# Patient Record
Sex: Female | Born: 1985 | Race: White | Hispanic: No | Smoking: Never smoker
Health system: Southern US, Community
[De-identification: ages and names within clinical notes are randomized; demographics above are authoritative.]

## PROBLEM LIST (undated history)

## (undated) DIAGNOSIS — F419 Anxiety disorder, unspecified: Secondary | ICD-10-CM

## (undated) DIAGNOSIS — J45909 Unspecified asthma, uncomplicated: Secondary | ICD-10-CM

## (undated) DIAGNOSIS — Z85820 Personal history of malignant melanoma of skin: Secondary | ICD-10-CM

## (undated) DIAGNOSIS — F32A Depression, unspecified: Secondary | ICD-10-CM

## (undated) DIAGNOSIS — IMO0002 Reserved for concepts with insufficient information to code with codable children: Secondary | ICD-10-CM

## (undated) DIAGNOSIS — Z9889 Other specified postprocedural states: Secondary | ICD-10-CM

## (undated) DIAGNOSIS — N840 Polyp of corpus uteri: Secondary | ICD-10-CM

## (undated) DIAGNOSIS — Z3480 Encounter for supervision of other normal pregnancy, unspecified trimester: Secondary | ICD-10-CM

## (undated) DIAGNOSIS — F329 Major depressive disorder, single episode, unspecified: Secondary | ICD-10-CM

## (undated) DIAGNOSIS — K589 Irritable bowel syndrome without diarrhea: Secondary | ICD-10-CM

## (undated) DIAGNOSIS — B009 Herpesviral infection, unspecified: Secondary | ICD-10-CM

## (undated) HISTORY — DX: Irritable bowel syndrome, unspecified: K58.9

## (undated) HISTORY — PX: INDUCED ABORTION: SHX677

## (undated) HISTORY — DX: Depression, unspecified: F32.A

## (undated) HISTORY — DX: Reserved for concepts with insufficient information to code with codable children: IMO0002

## (undated) HISTORY — DX: Herpesviral infection, unspecified: B00.9

## (undated) HISTORY — DX: Other specified postprocedural states: Z98.890

## (undated) HISTORY — DX: Anxiety disorder, unspecified: F41.9

## (undated) HISTORY — DX: Other specified postprocedural states: Z85.820

## (undated) HISTORY — DX: Major depressive disorder, single episode, unspecified: F32.9

---
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
## 2004-02-06 ENCOUNTER — Emergency Department (HOSPITAL_COMMUNITY): Admission: EM | Admit: 2004-02-06 | Discharge: 2004-02-06 | Payer: Self-pay | Admitting: Emergency Medicine

## 2005-02-26 ENCOUNTER — Emergency Department (HOSPITAL_COMMUNITY): Admission: EM | Admit: 2005-02-26 | Discharge: 2005-02-26 | Payer: Self-pay | Admitting: Emergency Medicine
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
## 2005-10-14 ENCOUNTER — Emergency Department (HOSPITAL_COMMUNITY): Admission: EM | Admit: 2005-10-14 | Discharge: 2005-10-14 | Payer: Self-pay | Admitting: Emergency Medicine

## 2006-04-18 ENCOUNTER — Emergency Department (HOSPITAL_COMMUNITY): Admission: EM | Admit: 2006-04-18 | Discharge: 2006-04-18 | Payer: Self-pay | Admitting: Emergency Medicine

## 2009-02-05 ENCOUNTER — Inpatient Hospital Stay (HOSPITAL_COMMUNITY): Admission: AD | Admit: 2009-02-05 | Discharge: 2009-02-05 | Payer: Self-pay | Admitting: Obstetrics and Gynecology

## 2009-04-08 ENCOUNTER — Inpatient Hospital Stay (HOSPITAL_COMMUNITY): Admission: AD | Admit: 2009-04-08 | Discharge: 2009-04-08 | Payer: Self-pay | Admitting: Obstetrics and Gynecology

## 2010-02-06 ENCOUNTER — Inpatient Hospital Stay (HOSPITAL_COMMUNITY): Admission: AD | Admit: 2010-02-06 | Discharge: 2009-06-06 | Payer: Self-pay | Admitting: Obstetrics and Gynecology

## 2010-05-21 LAB — CBC
HCT: 35.1 % — ABNORMAL LOW (ref 36.0–46.0)
HCT: 41.7 % (ref 36.0–46.0)
Hemoglobin: 12.2 g/dL (ref 12.0–15.0)
Hemoglobin: 14.1 g/dL (ref 12.0–15.0)
MCHC: 33.9 g/dL (ref 30.0–36.0)
MCHC: 34.7 g/dL (ref 30.0–36.0)
MCV: 94.4 fL (ref 78.0–100.0)
MCV: 94.9 fL (ref 78.0–100.0)
Platelets: 115 10*3/uL — ABNORMAL LOW (ref 150–400)
Platelets: 142 10*3/uL — ABNORMAL LOW (ref 150–400)
RBC: 3.7 MIL/uL — ABNORMAL LOW (ref 3.87–5.11)
RBC: 4.42 MIL/uL (ref 3.87–5.11)
RDW: 13.6 % (ref 11.5–15.5)
RDW: 14.3 % (ref 11.5–15.5)
WBC: 10.2 10*3/uL (ref 4.0–10.5)
WBC: 13.7 10*3/uL — ABNORMAL HIGH (ref 4.0–10.5)

## 2010-05-21 LAB — RPR: RPR Ser Ql: NONREACTIVE

## 2010-05-22 LAB — STREP B DNA PROBE

## 2010-06-03 LAB — URINALYSIS, ROUTINE W REFLEX MICROSCOPIC
Hgb urine dipstick: NEGATIVE
Specific Gravity, Urine: 1.01 (ref 1.005–1.030)
Urobilinogen, UA: 0.2 mg/dL (ref 0.0–1.0)
pH: 7 (ref 5.0–8.0)

## 2010-06-03 LAB — WET PREP, GENITAL

## 2010-10-27 ENCOUNTER — Emergency Department (HOSPITAL_COMMUNITY)
Admission: EM | Admit: 2010-10-27 | Discharge: 2010-10-27 | Disposition: A | Discharge status: Home / Self Care | Payer: No Typology Code available for payment source | Attending: Emergency Medicine | Admitting: Emergency Medicine

## 2010-10-27 DIAGNOSIS — J45909 Unspecified asthma, uncomplicated: Secondary | ICD-10-CM | POA: Insufficient documentation

## 2010-10-27 DIAGNOSIS — F411 Generalized anxiety disorder: Secondary | ICD-10-CM | POA: Insufficient documentation

## 2010-10-27 DIAGNOSIS — O99891 Other specified diseases and conditions complicating pregnancy: Secondary | ICD-10-CM | POA: Insufficient documentation

## 2010-10-27 LAB — COMPREHENSIVE METABOLIC PANEL
AST: 14 U/L (ref 0–37)
Alkaline Phosphatase: 77 U/L (ref 39–117)
Calcium: 9.1 mg/dL (ref 8.4–10.5)
Chloride: 105 mEq/L (ref 96–112)
Creatinine, Ser: 0.65 mg/dL (ref 0.50–1.10)
GFR calc Af Amer: >60 mL/min (ref >60)
Sodium: 137 mEq/L (ref 135–145)

## 2010-10-27 LAB — RAPID URINE DRUG SCREEN, HOSP PERFORMED
Amphetamines: NOT DETECTED
Barbiturates: NOT DETECTED
Benzodiazepines: POSITIVE — AB
Cocaine: NOT DETECTED

## 2010-10-27 LAB — DIFFERENTIAL
Basophils Absolute: 0 10*3/uL (ref 0.0–0.1)
Eosinophils Absolute: 0.1 10*3/uL (ref 0.0–0.7)
Eosinophils Relative: 2 % (ref 0–5)
Monocytes Relative: 12 % (ref 3–12)
Neutrophils Relative %: 53 % (ref 43–77)

## 2010-10-27 LAB — CBC
HCT: 39.4 % (ref 36.0–46.0)
MCHC: 34 g/dL (ref 30.0–36.0)
Platelets: 217 10*3/uL (ref 150–400)
RDW: 13.1 % (ref 11.5–15.5)

## 2010-10-27 LAB — URINALYSIS, ROUTINE W REFLEX MICROSCOPIC
Bilirubin Urine: NEGATIVE
Glucose, UA: NEGATIVE mg/dL
Ketones, ur: NEGATIVE mg/dL
Leukocytes, UA: NEGATIVE
Protein, ur: NEGATIVE mg/dL
Urobilinogen, UA: 1 mg/dL (ref 0.0–1.0)
pH: 6.5 (ref 5.0–8.0)

## 2010-11-07 ENCOUNTER — Inpatient Hospital Stay (HOSPITAL_COMMUNITY)
Admission: AD | Admit: 2010-11-07 | Discharge: 2010-11-07 | Disposition: A | Discharge status: Home / Self Care | Payer: No Typology Code available for payment source | Source: Ambulatory Visit | Attending: Obstetrics and Gynecology | Admitting: Obstetrics and Gynecology

## 2010-11-07 ENCOUNTER — Inpatient Hospital Stay (HOSPITAL_COMMUNITY): Payer: No Typology Code available for payment source

## 2010-11-07 ENCOUNTER — Encounter (HOSPITAL_COMMUNITY): Payer: Self-pay | Admitting: *Deleted

## 2010-11-07 DIAGNOSIS — N831 Corpus luteum cyst of ovary, unspecified side: Secondary | ICD-10-CM | POA: Insufficient documentation

## 2010-11-07 DIAGNOSIS — R42 Dizziness and giddiness: Secondary | ICD-10-CM | POA: Insufficient documentation

## 2010-11-07 DIAGNOSIS — O99891 Other specified diseases and conditions complicating pregnancy: Secondary | ICD-10-CM | POA: Insufficient documentation

## 2010-11-07 DIAGNOSIS — R110 Nausea: Secondary | ICD-10-CM | POA: Insufficient documentation

## 2010-11-07 DIAGNOSIS — R109 Unspecified abdominal pain: Secondary | ICD-10-CM | POA: Insufficient documentation

## 2010-11-07 DIAGNOSIS — O34599 Maternal care for other abnormalities of gravid uterus, unspecified trimester: Secondary | ICD-10-CM | POA: Insufficient documentation

## 2010-11-07 LAB — CBC
HCT: 40 % (ref 36.0–46.0)
MCH: 30 pg (ref 26.0–34.0)
MCHC: 34 g/dL (ref 30.0–36.0)
Platelets: 251 10*3/uL (ref 150–400)
RDW: 13.1 % (ref 11.5–15.5)
WBC: 9.3 10*3/uL (ref 4.0–10.5)

## 2010-11-07 LAB — DIFFERENTIAL
Basophils Relative: 0 % (ref 0–1)
Lymphs Abs: 2.8 10*3/uL (ref 0.7–4.0)
Monocytes Absolute: 0.8 10*3/uL (ref 0.1–1.0)
Neutro Abs: 5.5 10*3/uL (ref 1.7–7.7)

## 2010-11-07 LAB — CREATININE, SERUM
Creatinine, Ser: 0.47 mg/dL — ABNORMAL LOW (ref 0.50–1.10)
GFR calc Af Amer: >60 mL/min (ref >60)
GFR calc non Af Amer: >60 mL/min (ref >60)

## 2010-11-07 LAB — HCG, QUANTITATIVE, PREGNANCY: hCG, Beta Chain, Quant, S: 33522 m[IU]/mL — ABNORMAL HIGH (ref <5)

## 2010-11-07 LAB — AST: AST: 14 U/L (ref 0–37)

## 2010-11-07 NOTE — Plan of Care (Signed)
[place] not read : unofficial report IUP, [place] trying to get report read, pt left to eat without asking staff, she was called via phone and says she is returning now, she had informed the front desk of her leaving

## 2010-11-07 NOTE — ED Provider Notes (Signed)
History     Chief Complaint  Patient presents with  . Abdominal Pain  . Dizziness  . Nausea   Pt is a G3P2  HPI Comments: Pt has vague complaints of feeling flush, nauseous, lack of appetite, dizzy. Denies VB or d/c. States pain is intermittent and not associated with symptoms.   Abdominal Pain This is a new problem. The current episode started in the past 7 days. The onset quality is gradual. The problem occurs daily. The problem has been unchanged. The pain is located in the suprapubic region. The pain is mild. The quality of the pain is cramping. The abdominal pain does not radiate. Associated symptoms include anorexia and nausea. The pain is aggravated by nothing. The pain is relieved by nothing. She has tried nothing for the symptoms. Prior diagnostic workup includes ultrasound.     No past medical history on file.  No past surgical history on file.  No family history on file.  History  Substance Use Topics  . Smoking status: Not on file  . Smokeless tobacco: Not on file  . Alcohol Use: Not on file    Allergies:  Allergies  Allergen Reactions  . Latex     Skin irritation  . Neomycin Swelling    No prescriptions prior to admission    Review of Systems  Constitutional: Positive for chills and malaise/fatigue.  Gastrointestinal: Positive for nausea, abdominal pain and anorexia.  Neurological: Positive for dizziness and weakness.  All other systems reviewed and are negative.   Physical Exam   Blood pressure 108/66, pulse 92, temperature 98.4 F (36.9 C), resp. rate 18, height 6' 3.5" (1.918 m), weight 70.818 kg (156 lb 2 oz), last menstrual period 09/24/2010.  Physical Exam  Constitutional: She is oriented to person, place, and time. She appears well-developed and well-nourished.  Cardiovascular: Normal rate.   Respiratory: Effort normal.  GI: Soft.  Neurological: She is alert and oriented to person, place, and time.  Skin: Skin is warm and dry.    MAU  Course  Procedures [place] CBC    Assessment and Plan  US reveals GS at [redacted]w[redacted]d - minimally deformed, no embryo or cardiac activity, +YS, R corpus luteum cyst CBC nl Wbc - 9.3 hgb - 13.6 hct - 40 plt - 251 Diff is nl BCHG quant - 33, 522 O pos  Will repeat quant on 9/9  D/W Dr Evlyn Kanner M 11/07/2010, 8:59 PM

## 2010-11-09 ENCOUNTER — Inpatient Hospital Stay (HOSPITAL_COMMUNITY)
Admission: AD | Admit: 2010-11-09 | Discharge: 2010-11-09 | Disposition: A | Discharge status: Home / Self Care | Payer: No Typology Code available for payment source | Source: Ambulatory Visit | Attending: Obstetrics and Gynecology | Admitting: Obstetrics and Gynecology

## 2010-11-09 DIAGNOSIS — O99891 Other specified diseases and conditions complicating pregnancy: Secondary | ICD-10-CM | POA: Insufficient documentation

## 2010-11-09 NOTE — Progress Notes (Signed)
No bleeding no pain at present here for BHCG

## 2010-11-09 NOTE — Progress Notes (Signed)
S: pt denies pain or VB  O: GHCG on 9/7 was 33, 522 Today on 9/9 was 46, 123  A: IUP at 6wks without embryo  P: F/U with viability [place] with appt on Thurs Pt to come in with severe pain or bleeding

## 2010-11-11 ENCOUNTER — Other Ambulatory Visit (HOSPITAL_COMMUNITY): Payer: Self-pay | Admitting: Obstetrics and Gynecology

## 2010-11-11 ENCOUNTER — Ambulatory Visit (HOSPITAL_COMMUNITY)
Admission: RE | Admit: 2010-11-11 | Discharge: 2010-11-11 | Disposition: A | Discharge status: Home / Self Care | Payer: No Typology Code available for payment source | Source: Ambulatory Visit | Attending: Obstetrics and Gynecology | Admitting: Obstetrics and Gynecology

## 2010-11-11 DIAGNOSIS — O3680X0 Pregnancy with inconclusive fetal viability, not applicable or unspecified: Secondary | ICD-10-CM

## 2010-11-11 DIAGNOSIS — Z3689 Encounter for other specified antenatal screening: Secondary | ICD-10-CM | POA: Insufficient documentation

## 2010-11-11 DIAGNOSIS — O209 Hemorrhage in early pregnancy, unspecified: Secondary | ICD-10-CM | POA: Insufficient documentation

## 2010-12-19 LAB — GC/CHLAMYDIA PROBE AMP, GENITAL: Chlamydia: NEGATIVE

## 2010-12-19 LAB — HEPATITIS B SURFACE ANTIGEN: Hepatitis B Surface Ag: NEGATIVE

## 2010-12-19 LAB — RUBELLA ANTIBODY, IGM: Rubella: UNDETERMINED

## 2010-12-19 LAB — ABO/RH: RH Type: POSITIVE

## 2010-12-19 LAB — ANTIBODY SCREEN: Antibody Screen: NEGATIVE

## 2011-03-03 NOTE — L&D Delivery Note (Signed)
Delivery Note At 6:06 PM a viable female was delivered via  (Presentation: OP rotated to OA; ROT  ).  APGAR: 8 ,9; weight 8#10.   Placenta status: delivered intact to patient disposal for encapsulation  Cord:  with the following complications: none .    Anesthesia:  epidural Episiotomy: none Lacerations: 1st degree Suture Repair: 3.0 vicryl rapide Est. Blood Loss (mL): 500cc  Mom to postpartum.  Baby to nursery-stable.  BOVARD,Paddy Neis 06/24/2011, 6:29 PM  Br/O+/Contra - POPs/R=

## 2011-06-08 LAB — STREP B DNA PROBE: GBS: NEGATIVE

## 2011-06-18 ENCOUNTER — Encounter (HOSPITAL_COMMUNITY): Payer: Self-pay | Admitting: *Deleted

## 2011-06-18 ENCOUNTER — Telehealth (HOSPITAL_COMMUNITY): Payer: Self-pay | Admitting: *Deleted

## 2011-06-18 NOTE — Telephone Encounter (Signed)
Preadmission screen  

## 2011-06-23 ENCOUNTER — Encounter (HOSPITAL_COMMUNITY): Payer: Self-pay

## 2011-06-23 DIAGNOSIS — Z3480 Encounter for supervision of other normal pregnancy, unspecified trimester: Secondary | ICD-10-CM

## 2011-06-23 HISTORY — DX: Encounter for supervision of other normal pregnancy, unspecified trimester: Z34.80
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
## 2011-06-24 ENCOUNTER — Inpatient Hospital Stay (HOSPITAL_COMMUNITY)
Admission: RE | Admit: 2011-06-24 | Discharge: 2011-06-26 | DRG: 774 | Disposition: A | Discharge status: Home / Self Care | Payer: Medicaid Other | Source: Ambulatory Visit | Attending: Obstetrics and Gynecology | Admitting: Obstetrics and Gynecology

## 2011-06-24 ENCOUNTER — Encounter (HOSPITAL_COMMUNITY): Payer: Self-pay

## 2011-06-24 ENCOUNTER — Inpatient Hospital Stay (HOSPITAL_COMMUNITY): Payer: Medicaid Other | Admitting: Anesthesiology

## 2011-06-24 ENCOUNTER — Encounter (HOSPITAL_COMMUNITY): Payer: Self-pay | Admitting: Anesthesiology

## 2011-06-24 VITALS — BP 119/74 | HR 83 | Temp 97.3°F | Resp 18 | Ht 65.5 in | Wt 184.0 lb

## 2011-06-24 DIAGNOSIS — A6000 Herpesviral infection of urogenital system, unspecified: Secondary | ICD-10-CM | POA: Diagnosis present

## 2011-06-24 DIAGNOSIS — O98519 Other viral diseases complicating pregnancy, unspecified trimester: Secondary | ICD-10-CM | POA: Diagnosis present

## 2011-06-24 DIAGNOSIS — Z3480 Encounter for supervision of other normal pregnancy, unspecified trimester: Secondary | ICD-10-CM

## 2011-06-24 HISTORY — DX: Encounter for supervision of other normal pregnancy, unspecified trimester: Z34.80

## 2011-06-24 LAB — RPR: RPR Ser Ql: NONREACTIVE

## 2011-06-24 LAB — CBC
Hemoglobin: 12.5 g/dL (ref 12.0–15.0)
RBC: 4.08 MIL/uL (ref 3.87–5.11)

## 2011-06-24 MED ORDER — LACTATED RINGERS IV SOLN
INTRAVENOUS | Status: DC
  Administered 2011-06-24 (×2): via INTRAVENOUS

## 2011-06-24 MED ORDER — LACTATED RINGERS IV SOLN
500.0000 mL | Freq: Once | INTRAVENOUS | Status: AC
  Administered 2011-06-24: 500 mL via INTRAVENOUS

## 2011-06-24 MED ORDER — PRENATAL MULTIVITAMIN CH
1.0000 | ORAL_TABLET | Freq: Every day | ORAL | Status: DC
  Administered 2011-06-25 – 2011-06-26 (×2): 1 via ORAL
  Filled 2011-06-24 (×2): qty 1

## 2011-06-24 MED ORDER — LIDOCAINE HCL (PF) 1 % IJ SOLN
30.0000 mL | INTRAMUSCULAR | Status: DC | PRN
  Filled 2011-06-24: qty 30

## 2011-06-24 MED ORDER — BENZOCAINE-MENTHOL 20-0.5 % EX AERO
1.0000 "application " | INHALATION_SPRAY | CUTANEOUS | Status: DC | PRN
  Filled 2011-06-24: qty 56

## 2011-06-24 MED ORDER — WITCH HAZEL-GLYCERIN EX PADS: 1.0000 "application " | MEDICATED_PAD | CUTANEOUS | Status: DC | PRN

## 2011-06-24 MED ORDER — DIPHENHYDRAMINE HCL 50 MG/ML IJ SOLN
12.5000 mg | INTRAMUSCULAR | Status: DC | PRN
  Administered 2011-06-24: 12.5 mg via INTRAVENOUS
  Filled 2011-06-24: qty 1

## 2011-06-24 MED ORDER — OXYCODONE-ACETAMINOPHEN 5-325 MG PO TABS
1.0000 | ORAL_TABLET | ORAL | Status: DC | PRN
  Administered 2011-06-25 (×2): 1 via ORAL
  Filled 2011-06-24: qty 1
  Filled 2011-06-24: qty 2

## 2011-06-24 MED ORDER — ZOLPIDEM TARTRATE 5 MG PO TABS: 5.0000 mg | ORAL_TABLET | Freq: Every evening | ORAL | Status: DC | PRN

## 2011-06-24 MED ORDER — CITRIC ACID-SODIUM CITRATE 334-500 MG/5ML PO SOLN: 30.0000 mL | ORAL | Status: DC | PRN

## 2011-06-24 MED ORDER — PRENATAL MULTIVITAMIN CH: 1.0000 | ORAL_TABLET | Freq: Every day | ORAL | Status: DC

## 2011-06-24 MED ORDER — ONDANSETRON HCL 4 MG/2ML IJ SOLN: 4.0000 mg | INTRAMUSCULAR | Status: DC | PRN

## 2011-06-24 MED ORDER — FLEET ENEMA 7-19 GM/118ML RE ENEM: 1.0000 | ENEMA | RECTAL | Status: DC | PRN

## 2011-06-24 MED ORDER — OXYCODONE-ACETAMINOPHEN 5-325 MG PO TABS: 1.0000 | ORAL_TABLET | ORAL | Status: DC | PRN

## 2011-06-24 MED ORDER — SENNOSIDES-DOCUSATE SODIUM 8.6-50 MG PO TABS
2.0000 | ORAL_TABLET | Freq: Every day | ORAL | Status: DC
  Administered 2011-06-24: 2 via ORAL

## 2011-06-24 MED ORDER — LACTATED RINGERS IV SOLN: INTRAVENOUS | Status: DC

## 2011-06-24 MED ORDER — DIPHENHYDRAMINE HCL 25 MG PO CAPS: 25.0000 mg | ORAL_CAPSULE | Freq: Four times a day (QID) | ORAL | Status: DC | PRN

## 2011-06-24 MED ORDER — LACTATED RINGERS IV SOLN: 500.0000 mL | INTRAVENOUS | Status: DC | PRN

## 2011-06-24 MED ORDER — FENTANYL 2.5 MCG/ML BUPIVACAINE 1/10 % EPIDURAL INFUSION (WH - ANES)
14.0000 mL/h | INTRAMUSCULAR | Status: DC
  Administered 2011-06-24: 14 mL/h via EPIDURAL
  Filled 2011-06-24 (×2): qty 60

## 2011-06-24 MED ORDER — DIBUCAINE 1 % RE OINT: 1.0000 "application " | TOPICAL_OINTMENT | RECTAL | Status: DC | PRN

## 2011-06-24 MED ORDER — SIMETHICONE 80 MG PO CHEW
80.0000 mg | CHEWABLE_TABLET | ORAL | Status: DC | PRN
  Administered 2011-06-25: 80 mg via ORAL

## 2011-06-24 MED ORDER — TERBUTALINE SULFATE 1 MG/ML IJ SOLN: 0.2500 mg | Freq: Once | INTRAMUSCULAR | Status: DC | PRN

## 2011-06-24 MED ORDER — PHENYLEPHRINE 40 MCG/ML (10ML) SYRINGE FOR IV PUSH (FOR BLOOD PRESSURE SUPPORT)
80.0000 ug | PREFILLED_SYRINGE | INTRAVENOUS | Status: DC | PRN
  Filled 2011-06-24 (×2): qty 5

## 2011-06-24 MED ORDER — FENTANYL 2.5 MCG/ML BUPIVACAINE 1/10 % EPIDURAL INFUSION (WH - ANES)
INTRAMUSCULAR | Status: DC | PRN
  Administered 2011-06-24: 14 mL/h via EPIDURAL

## 2011-06-24 MED ORDER — OXYTOCIN BOLUS FROM INFUSION
500.0000 mL | Freq: Once | INTRAVENOUS | Status: DC
Start: 2011-06-24 — End: 2011-06-24
  Filled 2011-06-24: qty 500

## 2011-06-24 MED ORDER — ONDANSETRON HCL 4 MG PO TABS: 4.0000 mg | ORAL_TABLET | ORAL | Status: DC | PRN

## 2011-06-24 MED ORDER — OXYTOCIN 20 UNITS IN LACTATED RINGERS INFUSION - SIMPLE: 125.0000 mL/h | Freq: Once | INTRAVENOUS | Status: DC

## 2011-06-24 MED ORDER — ONDANSETRON HCL 4 MG/2ML IJ SOLN
4.0000 mg | Freq: Four times a day (QID) | INTRAMUSCULAR | Status: DC | PRN
  Administered 2011-06-24: 4 mg via INTRAVENOUS
  Filled 2011-06-24: qty 2

## 2011-06-24 MED ORDER — IBUPROFEN 600 MG PO TABS
600.0000 mg | ORAL_TABLET | Freq: Four times a day (QID) | ORAL | Status: DC | PRN
  Administered 2011-06-24: 600 mg via ORAL
  Filled 2011-06-24: qty 1

## 2011-06-24 MED ORDER — TETANUS-DIPHTH-ACELL PERTUSSIS 5-2.5-18.5 LF-MCG/0.5 IM SUSP
0.5000 mL | Freq: Once | INTRAMUSCULAR | Status: AC
  Administered 2011-06-25: 0.5 mL via INTRAMUSCULAR
  Filled 2011-06-24: qty 0.5

## 2011-06-24 MED ORDER — EPHEDRINE 5 MG/ML INJ: 10.0000 mg | INTRAVENOUS | Status: DC | PRN

## 2011-06-24 MED ORDER — PHENYLEPHRINE 40 MCG/ML (10ML) SYRINGE FOR IV PUSH (FOR BLOOD PRESSURE SUPPORT): 80.0000 ug | PREFILLED_SYRINGE | INTRAVENOUS | Status: DC | PRN

## 2011-06-24 MED ORDER — BUTORPHANOL TARTRATE 2 MG/ML IJ SOLN: 2.0000 mg | INTRAMUSCULAR | Status: DC | PRN

## 2011-06-24 MED ORDER — IBUPROFEN 600 MG PO TABS
600.0000 mg | ORAL_TABLET | Freq: Four times a day (QID) | ORAL | Status: DC
  Administered 2011-06-25 – 2011-06-26 (×6): 600 mg via ORAL
  Filled 2011-06-24 (×5): qty 1

## 2011-06-24 MED ORDER — VALACYCLOVIR HCL 500 MG PO TABS
500.0000 mg | ORAL_TABLET | Freq: Every day | ORAL | Status: DC
  Administered 2011-06-24: 500 mg via ORAL
  Filled 2011-06-24 (×2): qty 1

## 2011-06-24 MED ORDER — EPHEDRINE 5 MG/ML INJ
10.0000 mg | INTRAVENOUS | Status: DC | PRN
  Filled 2011-06-24: qty 4

## 2011-06-24 MED ORDER — LANOLIN HYDROUS EX OINT: TOPICAL_OINTMENT | CUTANEOUS | Status: DC | PRN

## 2011-06-24 MED ORDER — ACETAMINOPHEN 325 MG PO TABS
650.0000 mg | ORAL_TABLET | ORAL | Status: DC | PRN
  Administered 2011-06-24: 650 mg via ORAL
  Filled 2011-06-24: qty 2

## 2011-06-24 MED ORDER — OXYTOCIN 20 UNITS IN LACTATED RINGERS INFUSION - SIMPLE
1.0000 m[IU]/min | INTRAVENOUS | Status: DC
  Administered 2011-06-24: 5 m[IU]/min via INTRAVENOUS
  Administered 2011-06-24: 2 m[IU]/min via INTRAVENOUS
  Filled 2011-06-24: qty 1000

## 2011-06-24 MED ORDER — LIDOCAINE HCL (PF) 1 % IJ SOLN
INTRAMUSCULAR | Status: DC | PRN
  Administered 2011-06-24 (×2): 4 mL

## 2011-06-24 NOTE — Progress Notes (Signed)
Tina Benjamin is a 26 y.o. G3P2002 at [redacted]w[redacted]d admitted for induction of labor due to Elective at term.  Subjective: Comfortable with epidural  Objective: BP 105/66  Pulse 65  Temp(Src) 98.5 F (36.9 C) (Oral)  Resp 20  Ht 5' 5.5" (1.664 m)  Wt 83.462 kg (184 lb)  BMI 30.15 kg/m2  SpO2 100%  LMP 09/24/2010      FHT:  FHR: 145 bpm, variability: moderate,  accelerations:  Present,  decelerations:  Absent UC:   regular, every 2-3 minutes SVE:   Dilation: 5 Effacement (%): 70 Station: -2 Exam by:: Bovard, MD  Labs: Lab Results  Component Value Date   WBC 9.5 06/24/2011   HGB 12.5 06/24/2011   HCT 36.8 06/24/2011   MCV 90.2 06/24/2011   PLT 156 06/24/2011    Assessment / Plan: Induction of labor due to term with favorable cervix,  progressing well on pitocin  Labor: Progressing normally AROM for copious clear fluid w/o diff/comp Preeclampsia:  no signs or symptoms of toxicity Fetal Wellbeing:  Category I Pain Control:  Epidural I/D:  n/a Anticipated MOD:  NSVD  BOVARD,Jhoel Stieg 06/24/2011, 1:18 PM

## 2011-06-24 NOTE — Anesthesia Preprocedure Evaluation (Signed)
Anesthesia Evaluation  Patient identified by MRN, date of birth, ID band Patient awake    Reviewed: Allergy & Precautions, H&P , Patient's Chart, lab work & pertinent test results  Airway Mallampati: III TM Distance: >3 FB Neck ROM: full    Dental No notable dental hx. (+) Teeth Intact   Pulmonary neg pulmonary ROS,  breath sounds clear to auscultation  Pulmonary exam normal       Cardiovascular negative cardio ROS  Rhythm:regular Rate:Normal     Neuro/Psych PSYCHIATRIC DISORDERS Anxiety Depression negative neurological ROS  negative psych ROS   GI/Hepatic Neg liver ROS, GERD-  ,IBS   Endo/Other  negative endocrine ROS  Renal/GU negative Renal ROS  negative genitourinary   Musculoskeletal   Abdominal Normal abdominal exam  (+)   Peds  Hematology negative hematology ROS (+)   Anesthesia Other Findings   Reproductive/Obstetrics (+) Pregnancy                           Anesthesia Physical Anesthesia Plan  ASA: II  Anesthesia Plan: Epidural   Post-op Pain Management:    Induction:   Airway Management Planned:   Additional Equipment:   Intra-op Plan:   Post-operative Plan:   Informed Consent: I have reviewed the patients History and Physical, chart, labs and discussed the procedure including the risks, benefits and alternatives for the proposed anesthesia with the patient or authorized representative who has indicated his/her understanding and acceptance.     Plan Discussed with: Anesthesiologist  Anesthesia Plan Comments:         Anesthesia Quick Evaluation

## 2011-06-24 NOTE — Anesthesia Procedure Notes (Signed)
Epidural Patient location during procedure: OB Start time: 06/24/2011 11:10 AM  Staffing Anesthesiologist: Shandricka Monroy A. Performed by: anesthesiologist   Preanesthetic Checklist Completed: patient identified, site marked, surgical consent, pre-op evaluation, timeout performed, IV checked, risks and benefits discussed and monitors and equipment checked  Epidural Patient position: sitting Prep: site prepped and draped and DuraPrep Patient monitoring: continuous pulse ox and blood pressure Approach: midline Injection technique: LOR air  Needle:  Needle type: Tuohy  Needle gauge: 17 G Needle length: 9 cm Needle insertion depth: 5 cm cm Catheter type: closed end flexible Catheter size: 19 Gauge Catheter at skin depth: 10 cm Test dose: negative and Other  Assessment Events: blood not aspirated, injection not painful, no injection resistance, negative IV test and no paresthesia  Additional Notes Patient identified. Risks and benefits discussed including failed block, incomplete  Pain control, post dural puncture headache, nerve damage, paralysis, blood pressure Changes, nausea, vomiting, reactions to medications-both toxic and allergic and post Partum back pain. All questions were answered. Patient expressed understanding and wished to proceed. Sterile technique was used throughout procedure. Epidural site was Dressed with sterile barrier dressing. No paresthesias, signs of intravascular injection Or signs of intrathecal spread were encountered.  Patient was more comfortable after the epidural was dosed. Please see RN's note for documentation of vital signs and FHR which are stable.

## 2011-06-25 ENCOUNTER — Encounter (HOSPITAL_COMMUNITY): Payer: Self-pay

## 2011-06-25 LAB — CBC
Hemoglobin: 11.8 g/dL — ABNORMAL LOW (ref 12.0–15.0)
MCHC: 33.3 g/dL (ref 30.0–36.0)
RDW: 14.7 % (ref 11.5–15.5)

## 2011-06-25 NOTE — Anesthesia Postprocedure Evaluation (Signed)
  Anesthesia Post-op Note  Patient: Tina Benjamin  Procedure(s) Performed: * No procedures listed *  Patient Location: Mother/Baby  Anesthesia Type: Epidural  Level of Consciousness: awake  Airway and Oxygen Therapy: Patient Spontanous Breathing  Post-op Pain: none  Post-op Assessment: Patient's Cardiovascular Status Stable, Respiratory Function Stable, Patent Airway, No signs of Nausea or vomiting, Adequate PO intake, Pain level controlled, No headache, No backache, No residual numbness and No residual motor weakness  Post-op Vital Signs: Reviewed and stable  Complications: No apparent anesthesia complications

## 2011-06-25 NOTE — Progress Notes (Signed)
UR chart review completed.  

## 2011-06-25 NOTE — Progress Notes (Signed)
Post Partum Day 1 Subjective: no complaints, up ad lib, voiding, tolerating PO and nl lochia, pain controlled.  More cramping  Objective: Blood pressure 99/62, pulse 73, temperature 97.3 F (36.3 C), temperature source Oral, resp. rate 18, height 5' 5.5" (1.664 m), weight 83.462 kg (184 lb), SpO2 100.00%, unknown if currently breastfeeding.  Physical Exam:  General: alert and no distress Lochia: appropriate Uterine Fundus: firm   Basename 06/25/11 0512 06/24/11 0800  HGB 11.8* 12.5  HCT 35.4* 36.8    Assessment/Plan: Plan for discharge tomorrow, Breastfeeding and Lactation consult   LOS: 1 day   BOVARD,Mathea Frieling 06/25/2011, 9:48 AM

## 2011-06-26 MED ORDER — OXYCODONE-ACETAMINOPHEN 5-325 MG PO TABS: 1.0000 | ORAL_TABLET | Freq: Four times a day (QID) | ORAL | Status: AC | PRN

## 2011-06-26 MED ORDER — IBUPROFEN 600 MG PO TABS: 600.0000 mg | ORAL_TABLET | Freq: Four times a day (QID) | ORAL | Status: AC | PRN

## 2011-06-26 NOTE — Progress Notes (Signed)
Pt declined MMR vaccine due to allergy to neomycin. Boykin Peek, RN

## 2011-06-26 NOTE — Discharge Instructions (Signed)
booklet °

## 2011-06-26 NOTE — Progress Notes (Signed)
Patient ID: Tina Benjamin, female   DOB: 1985/07/07, 26 y.o.   MRN: [id number] #2 afebrile BP normal for d/c

## 2011-06-26 NOTE — Discharge Summary (Signed)
NAME:  Tina Benjamin, Tina Benjamin               ACCOUNT NO.:  [id number]  MEDICAL RECORD NO.:  [id number]  LOCATION:  9107                          FACILITY:  [facility]  PHYSICIAN:  Malachi Pro. Ambrose Mantle, M.D. DATE OF BIRTH:  01-26-1986  DATE OF ADMISSION:  06/24/2011 DATE OF DISCHARGE:  06/26/2011                              DISCHARGE SUMMARY   HISTORY OF PRESENT ILLNESS:  This is a 26 year old white female, para 2- 0-0-2, gravida 3, admitted for induction of labor.  Prenatal care was complicated by antibodies to herpes simplex virus suppressed with Valtrex.  Blood group and type O positive, negative antibody, rubella equivocal, RPR nonreactive.  Hepatitis B surface antigen negative, HIV negative, group B strep negative.  GC and Chlamydia negative.  First trimester screen and AFP normal.  One hour Glucola 71.  The patient was admitted.  Her cervix was 3 cm, 50%, vertex at a -3 station.  By 1:18 p.m. on June 24, 2011, she was 5 cm, 70%, vertex, at a -2.  She was progressing well on Pitocin.  Her pain was controlled with an epidural. She progressed to full dilatation and delivered a living female infant 8 pounds, 10 ounces.  Apgars of 8 and 9 at 1 and 5 minutes.  Presentation was OP, rotated OA.  First-degree laceration repaired with 3-0 Vicryl. Blood loss about 500 mL.  Dr. Ellyn Hack was in attendance.  Postpartum the patient did well and was discharged on the second postpartum day. Hemoglobin on admission 12.5, hematocrit 36.8, white count 9500, platelet count a 156,000, RPR nonreactive.  Followup hemoglobin 11.8.  FINAL DIAGNOSES:  Intrauterine pregnancy at term, delivered ROA.  OPERATION:  Spontaneous delivery ROA, repair of perineal laceration.  FINAL CONDITION:  Improved.  INSTRUCTIONS:  Our regular discharge instruction booklet.  The patient is advised to return to the office in 6 weeks for followup examination. She is given a prescription for Motrin 600 mg, 30 tablets, 1 every 6 hours as needed  for pain and Percocet 5/325, 30 tablets, 1 every 6 hours as needed for pain.     Malachi Pro. Ambrose Mantle, M.D.     TFH/MEDQ  D:  06/26/2011  T:  06/26/2011  Job:  [id number]
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
## 2011-06-26 NOTE — Progress Notes (Signed)
Pt had a plan to use her placenta for processing for use for PP depression and increased breast milk supply.  At discharge, the placenta could not be located.  Pt and FOB very upset over not being able to retrieve her placenta. Referred her to L&D for further search of placenta location.  Boykin Peek, discharging RN.

## 2012-05-02 ENCOUNTER — Ambulatory Visit (HOSPITAL_COMMUNITY): Payer: BC Managed Care – PPO | Admitting: Psychiatry

## 2012-05-11 ENCOUNTER — Ambulatory Visit (HOSPITAL_COMMUNITY): Payer: BC Managed Care – PPO | Admitting: Psychiatry

## 2013-05-01 ENCOUNTER — Emergency Department (HOSPITAL_COMMUNITY)
Admission: EM | Admit: 2013-05-01 | Discharge: 2013-05-01 | Discharge status: Absent without leave | Payer: BC Managed Care – PPO | Source: Home / Self Care

## 2013-06-07 ENCOUNTER — Emergency Department (HOSPITAL_COMMUNITY)
Admission: EM | Admit: 2013-06-07 | Discharge: 2013-06-07 | Disposition: A | Discharge status: Home / Self Care | Payer: BC Managed Care – PPO | Source: Home / Self Care | Attending: Family Medicine | Admitting: Family Medicine

## 2013-06-07 ENCOUNTER — Encounter (HOSPITAL_COMMUNITY): Payer: Self-pay | Admitting: Emergency Medicine

## 2013-06-07 DIAGNOSIS — E876 Hypokalemia: Secondary | ICD-10-CM

## 2013-06-07 LAB — POCT I-STAT, CHEM 8
BUN: 12 mg/dL (ref 6–23)
CALCIUM ION: 1.21 mmol/L (ref 1.12–1.23)
CHLORIDE: 103 meq/L (ref 96–112)
CREATININE: 0.7 mg/dL (ref 0.50–1.10)
GLUCOSE: 91 mg/dL (ref 70–99)
HCT: 44 % (ref 36.0–46.0)
Hemoglobin: 15 g/dL (ref 12.0–15.0)
Potassium: 3.6 mEq/L — ABNORMAL LOW (ref 3.7–5.3)
Sodium: 140 mEq/L (ref 137–147)
TCO2: 25 mmol/L (ref 0–100)

## 2013-06-07 LAB — POCT URINALYSIS DIP (DEVICE)
Bilirubin Urine: NEGATIVE
Glucose, UA: NEGATIVE mg/dL
Hgb urine dipstick: NEGATIVE
KETONES UR: NEGATIVE mg/dL
Leukocytes, UA: NEGATIVE
Nitrite: NEGATIVE
PH: 7 (ref 5.0–8.0)
PROTEIN: NEGATIVE mg/dL
SPECIFIC GRAVITY, URINE: 1.01 (ref 1.005–1.030)
UROBILINOGEN UA: 0.2 mg/dL (ref 0.0–1.0)

## 2013-06-07 LAB — POCT PREGNANCY, URINE: Preg Test, Ur: NEGATIVE

## 2013-06-07 NOTE — Discharge Instructions (Signed)
Your ECG was normal. Your urine studies were normal. Your blood chemistry was only notable for very mild changes in your potassium level. You are not anemic and you are not pregnant by our testing methods here today. I would strongly recommend that you contact the primary care physician listed on your discharge paperwork to establish for primary care and to have your thyroid function evaluated. Many of your symptoms can be the result of an over-active thyroid gland. If symptoms become suddenly worse or severe, please seek evaluation at your nearest ER.  Hypokalemia Hypokalemia means that the amount of potassium in the blood is lower than normal.Potassium is a chemical, called an electrolyte, that helps regulate the amount of fluid in the body. It also stimulates muscle contraction and helps nerves function properly.Most of the body's potassium is inside of cells, and only a very small amount is in the blood. Because the amount in the blood is so small, minor changes can be life-threatening. CAUSES  Antibiotics.  Diarrhea or vomiting.  Using laxatives too much, which can cause diarrhea.  Chronic kidney disease.  Water pills (diuretics).  Eating disorders (bulimia).  Low magnesium level.  Sweating a lot. SIGNS AND SYMPTOMS  Weakness.  Constipation.  Fatigue.  Muscle cramps.  Mental confusion.  Skipped heartbeats or irregular heartbeat (palpitations).  Tingling or numbness. DIAGNOSIS  Your health care provider can diagnose hypokalemia with blood tests. In addition to checking your potassium level, your health care provider may also check other lab tests. TREATMENT Hypokalemia can be treated with potassium supplements taken by mouth or adjustments in your current medicines. If your potassium level is very low, you may need to get potassium through a vein (IV) and be monitored in the hospital. A diet high in potassium is also helpful. Foods high in potassium are:  Nuts, such as  peanuts and pistachios.  Seeds, such as sunflower seeds and pumpkin seeds.  Peas, lentils, and lima beans.  Whole grain and bran cereals and breads.  Fresh fruit and vegetables, such as apricots, avocado, bananas, cantaloupe, kiwi, oranges, tomatoes, asparagus, and potatoes.  Orange and tomato juices.  Red meats.  Fruit yogurt. HOME CARE INSTRUCTIONS  Take all medicines as prescribed by your health care provider.  Maintain a healthy diet by including nutritious food, such as fruits, vegetables, nuts, whole grains, and lean meats.  If you are taking a laxative, be sure to follow the directions on the label. SEEK MEDICAL CARE IF:  Your weakness gets worse.  You feel your heart pounding or racing.  You are vomiting or having diarrhea.  You are diabetic and having trouble keeping your blood glucose in the normal range. SEEK IMMEDIATE MEDICAL CARE IF:  You have chest pain, shortness of breath, or dizziness.  You are vomiting or having diarrhea for more than 2 days.  You faint. MAKE SURE YOU:   Understand these instructions.  Will watch your condition.  Will get help right away if you are not doing well or get worse. Document Released: 02/16/2005 Document Revised: 12/07/2012 Document Reviewed: 08/19/2012 [facility] LPExitCare Patient Information 2014 [place], [place].

## 2013-06-07 NOTE — ED Notes (Signed)
Pt    Reports         For  About  3 days     She  Has  Had  Symptoms  Of  Rash        -         Anxious              Feeling  Flushed   And  Some  Numbness   In hands           Symptoms  Got  Worse  This  Am  And  Escalated  Today  sev  Hours  Ago       At this  Time  Pt  Is   Awake  And  Alert           Somewhat  Anxious               Speaking in  Complete  sentances       She  Is  Breast feeding at this  Time

## 2013-06-07 NOTE — ED Provider Notes (Signed)
Medical screening examination/treatment/procedure(s) were performed by resident physician or non-physician practitioner and as supervising physician I was immediately available for consultation/collaboration.   Barkley BrunsKINDL,Secundino Ellithorpe DOUGLAS MD.   Linna HoffJames D Shadee Montoya, MD 06/07/13 2125
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
## 2014-01-01 ENCOUNTER — Encounter (HOSPITAL_COMMUNITY): Payer: Self-pay | Admitting: Emergency Medicine

## 2014-12-25 ENCOUNTER — Ambulatory Visit: Payer: Self-pay | Admitting: Family Medicine

## 2015-01-06 ENCOUNTER — Encounter: Payer: Self-pay | Admitting: Emergency Medicine

## 2015-01-06 ENCOUNTER — Emergency Department
Admission: EM | Admit: 2015-01-06 | Discharge: 2015-01-07 | Disposition: A | Discharge status: Home / Self Care | Payer: BLUE CROSS/BLUE SHIELD | Attending: Emergency Medicine | Admitting: Emergency Medicine

## 2015-01-06 DIAGNOSIS — R232 Flushing: Secondary | ICD-10-CM | POA: Insufficient documentation

## 2015-01-06 DIAGNOSIS — Z79899 Other long term (current) drug therapy: Secondary | ICD-10-CM | POA: Insufficient documentation

## 2015-01-06 DIAGNOSIS — J45901 Unspecified asthma with (acute) exacerbation: Secondary | ICD-10-CM | POA: Insufficient documentation

## 2015-01-06 DIAGNOSIS — M791 Myalgia, unspecified site: Secondary | ICD-10-CM

## 2015-01-06 DIAGNOSIS — N951 Menopausal and female climacteric states: Secondary | ICD-10-CM | POA: Diagnosis not present

## 2015-01-06 DIAGNOSIS — Z91040 Latex allergy status: Secondary | ICD-10-CM | POA: Diagnosis not present

## 2015-01-06 DIAGNOSIS — R1010 Upper abdominal pain, unspecified: Secondary | ICD-10-CM

## 2015-01-06 HISTORY — DX: Polyp of corpus uteri: N84.0

## 2015-01-06 HISTORY — DX: Unspecified asthma, uncomplicated: J45.909

## 2015-01-06 NOTE — ED Notes (Signed)
Pt says she had an abortion early Friday afternoon; last night around 11pm she began having upper abd distention and generalized body aches with hot/cold flashes; pt says she was offered doxycycline as a prophylactic before the procedure but she is allergic to that medication; taking Cytotec 200mcg q6h since the procedure; taking ibuprofen for body aches but only helps her feel better for a few hours

## 2015-01-07 ENCOUNTER — Emergency Department: Payer: BLUE CROSS/BLUE SHIELD

## 2015-01-07 LAB — COMPREHENSIVE METABOLIC PANEL
ALT: 16 U/L (ref 14–54)
AST: 17 U/L (ref 15–41)
Albumin: 4.3 g/dL (ref 3.5–5.0)
Alkaline Phosphatase: 62 U/L (ref 38–126)
Anion gap: 5 (ref 5–15)
BILIRUBIN TOTAL: 0.8 mg/dL (ref 0.3–1.2)
BUN: 12 mg/dL (ref 6–20)
CHLORIDE: 110 mmol/L (ref 101–111)
CO2: 22 mmol/L (ref 22–32)
CREATININE: 0.6 mg/dL (ref 0.44–1.00)
Calcium: 9 mg/dL (ref 8.9–10.3)
Glucose, Bld: 99 mg/dL (ref 65–99)
POTASSIUM: 3.7 mmol/L (ref 3.5–5.1)
Sodium: 137 mmol/L (ref 135–145)
TOTAL PROTEIN: 7.5 g/dL (ref 6.5–8.1)

## 2015-01-07 LAB — CBC
HEMATOCRIT: 38.9 % (ref 35.0–47.0)
Hemoglobin: 13.1 g/dL (ref 12.0–16.0)
MCH: 29.6 pg (ref 26.0–34.0)
MCHC: 33.8 g/dL (ref 32.0–36.0)
MCV: 87.7 fL (ref 80.0–100.0)
PLATELETS: 185 10*3/uL (ref 150–440)
RBC: 4.44 MIL/uL (ref 3.80–5.20)
RDW: 13 % (ref 11.5–14.5)
WBC: 7.7 10*3/uL (ref 3.6–11.0)

## 2015-01-07 LAB — LIPASE, BLOOD: Lipase: 28 U/L (ref 11–51)

## 2015-01-07 MED ORDER — ONDANSETRON HCL 4 MG/2ML IJ SOLN: 4.0000 mg | Freq: Once | INTRAMUSCULAR | Status: DC

## 2015-01-07 MED ORDER — POLYETHYLENE GLYCOL 3350 17 G PO PACK: 17.0000 g | PACK | Freq: Every day | ORAL | Status: DC

## 2015-01-07 MED ORDER — IOHEXOL 300 MG/ML  SOLN
100.0000 mL | Freq: Once | INTRAMUSCULAR | Status: AC | PRN
  Administered 2015-01-07: 100 mL via INTRAVENOUS

## 2015-01-07 MED ORDER — MORPHINE SULFATE (PF) 4 MG/ML IV SOLN: 4.0000 mg | Freq: Once | INTRAVENOUS | Status: DC

## 2015-01-07 MED ORDER — IOHEXOL 240 MG/ML SOLN
25.0000 mL | INTRAMUSCULAR | Status: AC
  Administered 2015-01-07 (×2): 25 mL via ORAL

## 2015-01-07 NOTE — ED Notes (Addendum)
RN called CT to report finished contrast, CT said it will be 0200 when pt will gets scanned due to digestion of contrast.

## 2015-01-07 NOTE — ED Notes (Signed)
Patient transported to CT 

## 2015-01-07 NOTE — ED Notes (Signed)
MD Webster at bedside 
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
## 2015-01-07 NOTE — ED Notes (Signed)
MD Webster at bedside at this time.  

## 2015-01-07 NOTE — Discharge Instructions (Signed)
Abdominal Pain, Adult °Many things can cause abdominal pain. Usually, abdominal pain is not caused by a disease and will improve without treatment. It can often be observed and treated at home. Your health care provider will do a physical exam and possibly order blood tests and X-rays to help determine the seriousness of your pain. However, in many cases, more time must pass before a clear cause of the pain can be found. Before that point, your health care provider may not know if you need more testing or further treatment. °HOME CARE INSTRUCTIONS °Monitor your abdominal pain for any changes. The following actions may help to alleviate any discomfort you are experiencing: °· Only take over-the-counter or prescription medicines as directed by your health care provider. °· Do not take laxatives unless directed to do so by your health care provider. °· Try a clear liquid diet (broth, tea, or water) as directed by your health care provider. Slowly move to a bland diet as tolerated. °SEEK MEDICAL CARE IF: °· You have unexplained abdominal pain. °· You have abdominal pain associated with nausea or diarrhea. °· You have pain when you urinate or have a bowel movement. °· You experience abdominal pain that wakes you in the night. °· You have abdominal pain that is worsened or improved by eating food. °· You have abdominal pain that is worsened with eating fatty foods. °· You have a fever. °SEEK IMMEDIATE MEDICAL CARE IF: °· Your pain does not go away within 2 hours. °· You keep throwing up (vomiting). °· Your pain is felt only in portions of the abdomen, such as the right side or the left lower portion of the abdomen. °· You pass bloody or black tarry stools. °MAKE SURE YOU: °· Understand these instructions. °· Will watch your condition. °· Will get help right away if you are not doing well or get worse. °  °This information is not intended to replace advice given to you by your health care provider. Make sure you discuss  any questions you have with your health care provider. °  °Document Released: 11/26/2004 Document Revised: 11/07/2014 Document Reviewed: 10/26/2012 °Elsevier Interactive Patient Education ©2016 [employer] ° °Muscle Pain, Adult °Muscle pain (myalgia) may be caused by many things, including: °· Overuse or muscle strain, especially if you are not in shape. This is the most common cause of muscle pain. °· Injury. °· Bruises. °· Viruses, such as the flu. °· Infectious diseases. °· Fibromyalgia, which is a chronic condition that causes muscle tenderness, fatigue, and headache. °· Autoimmune diseases, including lupus. °· Certain drugs, including ACE inhibitors and statins. °Muscle pain may be mild or severe. In most cases, the pain lasts only a short time and goes away without treatment. To diagnose the cause of your muscle pain, your health care provider will take your medical history. This means he or she will ask you when your muscle pain began and what has been happening. If you have not had muscle pain for very long, your health care provider may want to wait before doing much testing. If your muscle pain has lasted a long time, your health care provider may want to run tests right away. If your health care provider thinks your muscle pain may be caused by illness, you may need to have additional tests to rule out certain conditions.  °Treatment for muscle pain depends on the cause. Home care is often enough to relieve muscle pain. Your health care provider may also prescribe anti-inflammatory medicine. °HOME CARE   INSTRUCTIONS °Watch your condition for any changes. The following actions may help to lessen any discomfort you are feeling: °· Only take over-the-counter or prescription medicines as directed by your health care provider. °· Apply ice to the sore muscle: °¨ Put ice in a plastic bag. °¨ Place a towel between your skin and the bag. °¨ Leave the ice on for 15-20 minutes, 3-4 times a day. °· You may  alternate applying hot and cold packs to the muscle as directed by your health care provider. °· If overuse is causing your muscle pain, slow down your activities until the pain goes away. °¨ Remember that it is normal to feel some muscle pain after starting a workout program. Muscles that have not been used often will be sore at first. °¨ Do regular, gentle exercises if you are not usually active. °¨ Warm up before exercising to lower your risk of muscle pain. °· Do not continue working out if the pain is very bad. Bad pain could mean you have injured a muscle. °SEEK MEDICAL CARE IF: °· Your muscle pain gets worse, and medicines do not help. °· You have muscle pain that lasts longer than 3 days. °· You have a rash or fever along with muscle pain. °· You have muscle pain after a tick bite. °· You have muscle pain while working out, even though you are in good physical condition. °· You have redness, soreness, or swelling along with muscle pain. °· You have muscle pain after starting a new medicine or changing the dose of a medicine. °SEEK IMMEDIATE MEDICAL CARE IF: °· You have trouble breathing. °· You have trouble swallowing. °· You have muscle pain along with a stiff neck, fever, and vomiting. °· You have severe muscle weakness or cannot move part of your body. °MAKE SURE YOU:  °· Understand these instructions. °· Will watch your condition. °· Will get help right away if you are not doing well or get worse. °  °This information is not intended to replace advice given to you by your health care provider. Make sure you discuss any questions you have with your health care provider. °  °Document Released: 01/08/2006 Document Revised: 03/09/2014 Document Reviewed: 12/13/2012 °Elsevier Interactive Patient Education ©2016 [employer]. ° °

## 2015-01-07 NOTE — ED Notes (Addendum)
Pt refuses any pain or nausea medication at this time. Pt states " I don't want anything else in my body today." states she has taken ibuprofen at home all day and is not in severe pain at this time. Will continue to monitor.

## 2015-01-30 ENCOUNTER — Emergency Department
Admission: EM | Admit: 2015-01-30 | Discharge: 2015-01-30 | Disposition: A | Discharge status: Home / Self Care | Payer: BLUE CROSS/BLUE SHIELD | Attending: Emergency Medicine | Admitting: Emergency Medicine

## 2015-01-30 ENCOUNTER — Emergency Department: Payer: BLUE CROSS/BLUE SHIELD

## 2015-01-30 DIAGNOSIS — R112 Nausea with vomiting, unspecified: Secondary | ICD-10-CM | POA: Insufficient documentation

## 2015-01-30 DIAGNOSIS — Z91040 Latex allergy status: Secondary | ICD-10-CM | POA: Diagnosis not present

## 2015-01-30 DIAGNOSIS — Z8719 Personal history of other diseases of the digestive system: Secondary | ICD-10-CM | POA: Diagnosis not present

## 2015-01-30 DIAGNOSIS — K5900 Constipation, unspecified: Secondary | ICD-10-CM | POA: Insufficient documentation

## 2015-01-30 DIAGNOSIS — R1084 Generalized abdominal pain: Secondary | ICD-10-CM | POA: Diagnosis present

## 2015-01-30 DIAGNOSIS — Z79899 Other long term (current) drug therapy: Secondary | ICD-10-CM | POA: Diagnosis not present

## 2015-01-30 DIAGNOSIS — Z3202 Encounter for pregnancy test, result negative: Secondary | ICD-10-CM | POA: Insufficient documentation

## 2015-01-30 LAB — CBC WITH DIFFERENTIAL/PLATELET
BASOS PCT: 0 %
Basophils Absolute: 0 10*3/uL (ref 0–0.1)
EOS ABS: 0 10*3/uL (ref 0–0.7)
EOS PCT: 1 %
HCT: 39.2 % (ref 35.0–47.0)
HEMOGLOBIN: 13.5 g/dL (ref 12.0–16.0)
Lymphocytes Relative: 18 %
Lymphs Abs: 0.7 10*3/uL — ABNORMAL LOW (ref 1.0–3.6)
MCH: 30.2 pg (ref 26.0–34.0)
MCHC: 34.3 g/dL (ref 32.0–36.0)
MCV: 88.1 fL (ref 80.0–100.0)
MONOS PCT: 10 %
Monocytes Absolute: 0.4 10*3/uL (ref 0.2–0.9)
NEUTROS PCT: 71 %
Neutro Abs: 2.9 10*3/uL (ref 1.4–6.5)
PLATELETS: 184 10*3/uL (ref 150–440)
RBC: 4.45 MIL/uL (ref 3.80–5.20)
RDW: 14 % (ref 11.5–14.5)
WBC: 4.1 10*3/uL (ref 3.6–11.0)

## 2015-01-30 LAB — POC URINE PREG, ED: PREG TEST UR: NEGATIVE

## 2015-01-30 LAB — URINALYSIS COMPLETE WITH MICROSCOPIC (ARMC ONLY)
Bacteria, UA: NONE SEEN
Bilirubin Urine: NEGATIVE
GLUCOSE, UA: NEGATIVE mg/dL
Leukocytes, UA: NEGATIVE
NITRITE: NEGATIVE
Protein, ur: NEGATIVE mg/dL
SPECIFIC GRAVITY, URINE: 1.01 (ref 1.005–1.030)
pH: 6 (ref 5.0–8.0)

## 2015-01-30 LAB — BASIC METABOLIC PANEL
ANION GAP: 6 (ref 5–15)
BUN: 9 mg/dL (ref 6–20)
CO2: 22 mmol/L (ref 22–32)
Calcium: 8.4 mg/dL — ABNORMAL LOW (ref 8.9–10.3)
Chloride: 110 mmol/L (ref 101–111)
Creatinine, Ser: 0.61 mg/dL (ref 0.44–1.00)
GLUCOSE: 93 mg/dL (ref 65–99)
Potassium: 3.5 mmol/L (ref 3.5–5.1)
Sodium: 138 mmol/L (ref 135–145)

## 2015-01-30 LAB — LIPASE, BLOOD: LIPASE: 23 U/L (ref 11–51)

## 2015-01-30 NOTE — ED Notes (Signed)
Pt verbalized that she would buy mag citrate at store and continue recommended treatment at home. Pt in no acute distress at time of d/c. And verbalized understanding of follow up. No belongings left in room upon assessment and cleaning.

## 2015-01-30 NOTE — ED Notes (Signed)
Patient comes in from home with complaints of generalized body aches that started yesterday.  Patient unsure if she has run a temperature and took last dose of ibuprofen for body aches at 12pm today.  Patient reports poor appetite with body aches.

## 2015-01-30 NOTE — ED Notes (Signed)
Urine cup in room and pt reports she will attempt to provide a urine sample as soon as possible.

## 2015-01-30 NOTE — ED Notes (Signed)
Pt stated,"i have flu like symptoms." Pt states she vomited this am and has had body aches. No respiratory symptoms and no diarrhea. Pt does not appear in distress. Pt denies taking the flu shot this season. She stated,"I have been taking motrin and I think I must be getting fevers as I have been sweating a lot. "
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
## 2015-01-30 NOTE — Discharge Instructions (Signed)
Constipation, Adult Constipation is when a person has fewer than three bowel movements a week, has difficulty having a bowel movement, or has stools that are dry, hard, or larger than normal. As people grow older, constipation is more common. A low-fiber diet, not taking in enough fluids, and taking certain medicines may make constipation worse.  CAUSES   Certain medicines, such as antidepressants, pain medicine, iron supplements, antacids, and water pills.   Certain diseases, such as diabetes, irritable bowel syndrome (IBS), thyroid disease, or depression.   Not drinking enough water.   Not eating enough fiber-rich foods.   Stress or travel.   Lack of physical activity or exercise.   Ignoring the urge to have a bowel movement.   Using laxatives too much.  SIGNS AND SYMPTOMS   Having fewer than three bowel movements a week.   Straining to have a bowel movement.   Having stools that are hard, dry, or larger than normal.   Feeling full or bloated.   Pain in the lower abdomen.   Not feeling relief after having a bowel movement.  DIAGNOSIS  Your health care provider will take a medical history and perform a physical exam. Further testing may be done for severe constipation. Some tests may include:  A barium enema X-ray to examine your rectum, colon, and, sometimes, your small intestine.   A sigmoidoscopy to examine your lower colon.   A colonoscopy to examine your entire colon. TREATMENT  Treatment will depend on the severity of your constipation and what is causing it. Some dietary treatments include drinking more fluids and eating more fiber-rich foods. Lifestyle treatments may include regular exercise. If these diet and lifestyle recommendations do not help, your health care provider may recommend taking over-the-counter laxative medicines to help you have bowel movements. Prescription medicines may be prescribed if over-the-counter medicines do not work.    HOME CARE INSTRUCTIONS   Eat foods that have a lot of fiber, such as fruits, vegetables, whole grains, and beans.  Limit foods high in fat and processed sugars, such as french fries, hamburgers, cookies, candies, and soda.   A fiber supplement may be added to your diet if you cannot get enough fiber from foods.   Drink enough fluids to keep your urine clear or pale yellow.   Exercise regularly or as directed by your health care provider.   Go to the restroom when you have the urge to go. Do not hold it.   Only take over-the-counter or prescription medicines as directed by your health care provider. Do not take other medicines for constipation without talking to your health care provider first.  [place] IF:   You have bright red blood in your stool.   Your constipation lasts for more than 4 days or gets worse.   You have abdominal or rectal pain.   You have thin, pencil-like stools.   You have unexplained weight loss. MAKE SURE YOU:   Understand these instructions.  Will watch your condition.  Will get help right away if you are not doing well or get worse.   This information is not intended to replace advice given to you by your health care provider. Make sure you discuss any questions you have with your health care provider.   Document Released: 11/15/2003 Document Revised: 03/09/2014 Document Reviewed: 11/28/2012 Elsevier Interactive Patient Education 2016 Elsevier Inc.  High-Fiber Diet Fiber, also called dietary fiber, is a type of carbohydrate found in fruits, vegetables, whole grains, and  beans. A high-fiber diet can have many health benefits. Your health care provider may recommend a high-fiber diet to help: °· Prevent constipation. Fiber can make your bowel movements more regular. °· Lower your cholesterol. °· Relieve hemorrhoids, uncomplicated diverticulosis, or irritable bowel syndrome. °· Prevent overeating as part of a weight-loss  plan. °· Prevent heart disease, type 2 diabetes, and certain cancers. °WHAT IS MY PLAN? °The recommended daily intake of fiber includes: °· 38 grams for men under age 50. °· 30 grams for men over age 50. °· 25 grams for women under age 50. °· 21 grams for women over age 50. °You can get the recommended daily intake of dietary fiber by eating a variety of fruits, vegetables, grains, and beans. Your health care provider may also recommend a fiber supplement if it is not possible to get enough fiber through your diet. °WHAT DO I NEED TO KNOW ABOUT A HIGH-FIBER DIET? °· Fiber supplements have not been widely studied for their effectiveness, so it is better to get fiber through food sources. °· Always check the fiber content on the nutrition facts label of any prepackaged food. Look for foods that contain at least 5 grams of fiber per serving. °· Ask your dietitian if you have questions about specific foods that are related to your condition, especially if those foods are not listed in the following section. °· Increase your daily fiber consumption gradually. Increasing your intake of dietary fiber too quickly may cause bloating, cramping, or gas. °· Drink plenty of water. Water helps you to digest fiber. °WHAT FOODS CAN I EAT? °Grains °Whole-grain breads. Multigrain cereal. Oats and oatmeal. Brown rice. Barley. Bulgur wheat. Millet. Bran muffins. Popcorn. Rye wafer crackers. °Vegetables °Sweet potatoes. Spinach. Kale. Artichokes. Cabbage. Broccoli. Green peas. Carrots. Squash. °Fruits °Berries. Pears. Apples. Oranges. Avocados. Prunes and raisins. Dried figs. °Meats and Other Protein Sources °Navy, kidney, pinto, and soy beans. Split peas. Lentils. Nuts and seeds. °Dairy °Fiber-fortified yogurt. °Beverages °Fiber-fortified soy milk. Fiber-fortified orange juice. °Other °Fiber bars. °The items listed above may not be a complete list of recommended foods or beverages. Contact your dietitian for more options. °WHAT FOODS  ARE NOT RECOMMENDED? °Grains °White bread. Pasta made with refined flour. White rice. °Vegetables °Fried potatoes. Canned vegetables. Well-cooked vegetables.  °Fruits °Fruit juice. Cooked, strained fruit. °Meats and Other Protein Sources °Fatty cuts of meat. Fried poultry or fried fish. °Dairy °Milk. Yogurt. Cream cheese. Sour cream. °Beverages °Soft drinks. °Other °Cakes and pastries. Butter and oils. °The items listed above may not be a complete list of foods and beverages to avoid. Contact your dietitian for more information. °WHAT ARE SOME TIPS FOR INCLUDING HIGH-FIBER FOODS IN MY DIET? °· Eat a wide variety of high-fiber foods. °· Make sure that half of all grains consumed each day are whole grains. °· Replace breads and cereals made from refined flour or white flour with whole-grain breads and cereals. °· Replace white rice with brown rice, bulgur wheat, or millet. °· Start the day with a breakfast that is high in fiber, such as a cereal that contains at least 5 grams of fiber per serving. °· Use beans in place of meat in soups, salads, or pasta. °· Eat high-fiber snacks, such as berries, raw vegetables, nuts, or popcorn. °  °This information is not intended to replace advice given to you by your health care provider. Make sure you discuss any questions you have with your health care provider. °  °Document Released: 02/16/2005 Document Revised: 03/09/2014 Document   Reviewed: 08/01/2013 Elsevier Interactive Patient Education 2016 [employer]   Drink the 1 bottle of magnesium citrate tonight, and another bottle tomorrow to induce bowel movements. Consider the use of a suppository once daily as needed. Increase fiber and reduce meat, cheese and bread intake until symptoms resolve. [facility] for further management.

## 2015-04-18 ENCOUNTER — Ambulatory Visit: Payer: BLUE CROSS/BLUE SHIELD | Admitting: Primary Care

## 2015-04-29 ENCOUNTER — Encounter: Payer: Self-pay | Admitting: Primary Care

## 2015-04-29 ENCOUNTER — Ambulatory Visit (INDEPENDENT_AMBULATORY_CARE_PROVIDER_SITE_OTHER): Payer: BLUE CROSS/BLUE SHIELD | Admitting: Primary Care

## 2015-04-29 VITALS — BP 120/72 | HR 71 | Temp 97.9°F | Ht 66.0 in | Wt 161.8 lb

## 2015-04-29 DIAGNOSIS — K589 Irritable bowel syndrome without diarrhea: Secondary | ICD-10-CM | POA: Diagnosis not present

## 2015-04-29 DIAGNOSIS — F418 Other specified anxiety disorders: Secondary | ICD-10-CM

## 2015-04-29 DIAGNOSIS — F419 Anxiety disorder, unspecified: Secondary | ICD-10-CM | POA: Insufficient documentation

## 2015-04-29 DIAGNOSIS — F329 Major depressive disorder, single episode, unspecified: Secondary | ICD-10-CM

## 2015-04-29 MED ORDER — ALPRAZOLAM 0.25 MG PO TABS: 0.2500 mg | ORAL_TABLET | Freq: Every day | ORAL | Status: DC | PRN

## 2015-04-29 NOTE — Assessment & Plan Note (Signed)
Prior history of, constipation type. Evaluated in ED several times with imaging resulting in constipation.  She would like GI referral given her FH of colon cancer. Constipation despite increase in fiber intake and miralax. Discussed to increase water consumption and to exercise. Referral placed per patient's request.
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
## 2015-04-29 NOTE — Assessment & Plan Note (Addendum)
Long standing history of. Failed treatment on Zoloft, Paxil, Effexor, Wellbutrin, buspar in past. Alprazolam is the only thing that "works". GAD 7 score today of 21. Discussed that alprazolam is not used to manage GAD. Due to multiple failed therapies with numerous antianxiety regimens, will send her to psychiatry for further evaluation of other psychiatric disorders. 1 time refill of alprazolam provided until she can get connected with psychiatry.

## 2015-04-29 NOTE — Patient Instructions (Signed)
Stop by the front desk and speak with either Shirlee Limerick or Revonda Standard regarding your referral to GI and psychiatry.   I will provide one refill of your alprazolam to help get your through until your evaluation with psychiatry.  Nasal Congestion/Sinus pressure: Try using Flonase (fluticasone) nasal spray. Instill 2 sprays in each nostril once daily. Also try a daily antihistamine such as Zyrtec or Allegra.   It was a pleasure to meet you today! Please don't hesitate to call me with any questions. Welcome to [facility]!

## 2015-04-29 NOTE — Progress Notes (Signed)
Pre visit review using our clinic review tool, if applicable. No additional management support is needed unless otherwise documented below in the visit note. 

## 2015-04-30 ENCOUNTER — Ambulatory Visit (INDEPENDENT_AMBULATORY_CARE_PROVIDER_SITE_OTHER): Payer: BLUE CROSS/BLUE SHIELD | Admitting: Obstetrics and Gynecology

## 2015-04-30 ENCOUNTER — Encounter: Payer: Self-pay | Admitting: Obstetrics and Gynecology

## 2015-04-30 VITALS — BP 123/64 | HR 85 | Ht 65.0 in | Wt 160.0 lb

## 2015-04-30 DIAGNOSIS — Z789 Other specified health status: Secondary | ICD-10-CM

## 2015-04-30 DIAGNOSIS — Z30018 Encounter for initial prescription of other contraceptives: Secondary | ICD-10-CM

## 2015-04-30 DIAGNOSIS — N926 Irregular menstruation, unspecified: Secondary | ICD-10-CM | POA: Diagnosis not present

## 2015-04-30 DIAGNOSIS — N941 Unspecified dyspareunia: Secondary | ICD-10-CM | POA: Diagnosis not present

## 2015-04-30 DIAGNOSIS — N943 Premenstrual tension syndrome: Secondary | ICD-10-CM

## 2015-04-30 DIAGNOSIS — F419 Anxiety disorder, unspecified: Secondary | ICD-10-CM | POA: Diagnosis not present

## 2015-04-30 DIAGNOSIS — R102 Pelvic and perineal pain: Secondary | ICD-10-CM | POA: Diagnosis not present

## 2015-04-30 DIAGNOSIS — N946 Dysmenorrhea, unspecified: Secondary | ICD-10-CM

## 2015-04-30 DIAGNOSIS — Z842 Family history of other diseases of the genitourinary system: Secondary | ICD-10-CM | POA: Diagnosis not present

## 2015-04-30 DIAGNOSIS — N920 Excessive and frequent menstruation with regular cycle: Secondary | ICD-10-CM | POA: Diagnosis not present

## 2015-04-30 DIAGNOSIS — Z9289 Personal history of other medical treatment: Secondary | ICD-10-CM

## 2015-04-30 LAB — POCT URINE PREGNANCY: PREG TEST UR: NEGATIVE

## 2015-04-30 MED ORDER — AZITHROMYCIN 500 MG PO TABS: 1000.0000 mg | ORAL_TABLET | Freq: Once | ORAL | Status: DC

## 2015-04-30 MED ORDER — ETONOGESTREL-ETHINYL ESTRADIOL 0.12-0.015 MG/24HR VA RING: VAGINAL_RING | VAGINAL | Status: DC
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
## 2015-04-30 NOTE — Patient Instructions (Addendum)
1. Zithromax 1 g orally is prescribed. 2. Pelvic ultrasound is ordered. 3. NuvaRing prescription is given along with samples. Start the NuvaRing on Sunday after next period starts 4. Return in 4 weeks for physical 5. Keep psychiatry referral as scheduled

## 2015-05-01 ENCOUNTER — Ambulatory Visit (INDEPENDENT_AMBULATORY_CARE_PROVIDER_SITE_OTHER): Payer: BLUE CROSS/BLUE SHIELD

## 2015-05-01 DIAGNOSIS — N926 Irregular menstruation, unspecified: Secondary | ICD-10-CM | POA: Insufficient documentation

## 2015-05-01 DIAGNOSIS — N920 Excessive and frequent menstruation with regular cycle: Secondary | ICD-10-CM | POA: Insufficient documentation

## 2015-05-01 DIAGNOSIS — R102 Pelvic and perineal pain: Secondary | ICD-10-CM | POA: Diagnosis not present

## 2015-05-01 DIAGNOSIS — N946 Dysmenorrhea, unspecified: Secondary | ICD-10-CM | POA: Insufficient documentation

## 2015-05-01 DIAGNOSIS — N943 Premenstrual tension syndrome: Secondary | ICD-10-CM | POA: Insufficient documentation

## 2015-05-01 DIAGNOSIS — Z9289 Personal history of other medical treatment: Secondary | ICD-10-CM | POA: Insufficient documentation

## 2015-05-01 DIAGNOSIS — Z842 Family history of other diseases of the genitourinary system: Secondary | ICD-10-CM | POA: Insufficient documentation

## 2015-05-01 DIAGNOSIS — F419 Anxiety disorder, unspecified: Secondary | ICD-10-CM | POA: Insufficient documentation

## 2015-05-01 DIAGNOSIS — N941 Unspecified dyspareunia: Secondary | ICD-10-CM | POA: Insufficient documentation

## 2015-05-07 ENCOUNTER — Other Ambulatory Visit: Payer: Self-pay | Admitting: Physician Assistant

## 2015-05-07 DIAGNOSIS — R1319 Other dysphagia: Secondary | ICD-10-CM

## 2015-05-10 ENCOUNTER — Ambulatory Visit: Payer: BLUE CROSS/BLUE SHIELD | Attending: Physician Assistant

## 2015-05-10 ENCOUNTER — Telehealth: Payer: Self-pay

## 2015-05-10 DIAGNOSIS — N912 Amenorrhea, unspecified: Secondary | ICD-10-CM

## 2015-05-10 NOTE — Telephone Encounter (Signed)
Pt is aware u/s from 3/1 is wnl. States today she had a pos preg test. 01/2015- s/p tab. Will order beta. Cant come in until Monday. Lab ordered.

## 2015-05-13 ENCOUNTER — Other Ambulatory Visit: Payer: Self-pay | Admitting: Obstetrics and Gynecology

## 2015-05-13 ENCOUNTER — Other Ambulatory Visit: Payer: BLUE CROSS/BLUE SHIELD

## 2015-05-14 ENCOUNTER — Telehealth: Payer: Self-pay | Admitting: Obstetrics and Gynecology

## 2015-05-14 LAB — BETA HCG QUANT (REF LAB): hCG Quant: 401 m[IU]/mL

## 2015-05-14 NOTE — Telephone Encounter (Signed)
PT HAD LABS YESTERDAY TO SEE IF SHE WAS PREGNANT. SHE WANTED THE RESULTS

## 2015-05-14 NOTE — Telephone Encounter (Signed)
Pt aware beta is pos (401). [id number] according to lmp. Suggested pt have another beta but declined at this time. NOB intake appt made for 4 weeks.

## 2015-05-15 ENCOUNTER — Other Ambulatory Visit: Payer: BLUE CROSS/BLUE SHIELD

## 2015-05-15 ENCOUNTER — Other Ambulatory Visit: Payer: Self-pay | Admitting: Obstetrics and Gynecology

## 2015-05-16 LAB — BETA HCG QUANT (REF LAB): HCG QUANT: 872 m[IU]/mL

## 2015-05-17 ENCOUNTER — Telehealth: Payer: Self-pay | Admitting: Obstetrics and Gynecology

## 2015-05-17 NOTE — Telephone Encounter (Signed)
Patient called requesting beta results.Thanks °

## 2015-05-17 NOTE — Telephone Encounter (Signed)
Pt aware beta wnl. Nob intake scheduled. C/o of vaginal itching. NO d/c. No new detergent, bodywash, or fabric softener. She used monistat 7d. May use external cream if needed. Try dove sensitive for vaginal area. If sx persist, she will need to be seen. Pt voices understanding.

## 2015-05-24 ENCOUNTER — Telehealth: Payer: Self-pay | Admitting: Obstetrics and Gynecology

## 2015-05-24 NOTE — Telephone Encounter (Signed)
Patient would like to talk to you about some symptoms she is having.

## 2015-05-24 NOTE — Telephone Encounter (Signed)
Early ob. C/o of spotting x 2. Only on the TP. NO sex for 1 week. She feels like her abd is bloated. Has increased fiber in her diet. BM normal. Mild burning with sx. Possible hvs outbreak. Pt doesn't see any lesions. Burns with urination at times. Advised pt to monitor bleeding  If persists she will need to be seen before intake visit on 4/11. Bloated abd could come from increase of fiber in her diet. If burn continues or she develops an outbreak we will need to see her. Pt voices understanding.

## 2015-05-29 ENCOUNTER — Encounter: Payer: BLUE CROSS/BLUE SHIELD | Admitting: Obstetrics and Gynecology

## 2015-06-11 ENCOUNTER — Ambulatory Visit (INDEPENDENT_AMBULATORY_CARE_PROVIDER_SITE_OTHER): Payer: BLUE CROSS/BLUE SHIELD | Admitting: Obstetrics and Gynecology

## 2015-06-11 VITALS — BP 112/68 | HR 83 | Wt 163.1 lb

## 2015-06-11 DIAGNOSIS — Z369 Encounter for antenatal screening, unspecified: Secondary | ICD-10-CM

## 2015-06-11 DIAGNOSIS — N926 Irregular menstruation, unspecified: Secondary | ICD-10-CM

## 2015-06-11 DIAGNOSIS — Z3490 Encounter for supervision of normal pregnancy, unspecified, unspecified trimester: Secondary | ICD-10-CM

## 2015-06-11 DIAGNOSIS — Z36 Encounter for antenatal screening of mother: Secondary | ICD-10-CM

## 2015-06-11 DIAGNOSIS — Z331 Pregnant state, incidental: Secondary | ICD-10-CM

## 2015-06-11 DIAGNOSIS — T7589XA Other specified effects of external causes, initial encounter: Secondary | ICD-10-CM

## 2015-06-11 DIAGNOSIS — Z113 Encounter for screening for infections with a predominantly sexual mode of transmission: Secondary | ICD-10-CM

## 2015-06-11 DIAGNOSIS — Z1389 Encounter for screening for other disorder: Secondary | ICD-10-CM

## 2015-06-11 NOTE — Progress Notes (Signed)
  [place] presents for NOB nurse interview visit. G-5.  P-3013. Pregnancy education material explained and given. There are cats in the home. Toxoplasmosis labs ordered. NOB labs ordered.  HIV labs and Drug screen were explained optional and she could opt out of tests but did not decline. Drug screen ordered. PNV encouraged. NT to discuss with provider. Pt wishes to save placenta so she can use it to make pills out of it because this helps with her pp depression.  Pt. To follow up with provider in 4 weeks for NOB physical.  All questions answered.  ZIKA EXPOSURE SCREEN:  The patient has not traveled to a [place] endemic area within the past 6 months, nor has she had unprotected sex with a partner who has travelled to a [place] endemic region within the past 6 months. The patient has been advised to notify us if these factors change any time during this current pregnancy, so adequate testing and monitoring can be initiated.

## 2015-06-11 NOTE — Patient Instructions (Signed)
Pregnancy and Toxoplasmosis Toxoplasmosis is an infection caused by a parasite. Your body can usually fight off the infection without you ever having symptoms. If you get toxoplasmosis during pregnancy, there is a chance the infection will spread to your baby. If this happens, your baby may develop serious health problems, such as blindness. Some of these problems may not show up for years. HOW DO PEOPLE GET TOXOPLASMOSIS? You can get toxoplasmosis if you:  Touch anything contaminated with infected cat feces and then touch your mouth.  Eat undercooked meat from an infected animal.  Eat fruits and vegetables grown in contaminated soil. Your baby can get toxoplasmosis through your blood supply if you are infected during pregnancy or just before pregnancy. HOW CAN I PROTECT MYSELF AND MY BABY AGAINST TOXOPLASMOSIS?   Do not get a new cat.  Do not touch stray cats.  If you have a sandbox, cover it when it is not being used.  Avoid working in soil where cats may leave feces.  Wear gloves when you work in the soil. Wash your hands with soap and water when you are finished.  If you have a cat:  Have someone else change the cat's litter box. He or she should wash his or her hands afterward.  Do not let your cat outside.  Do not feed your cat raw meat.  Do not eat undercooked meat, especially meat that has never been frozen.  Wash and peel all fruits and vegetables before eating them.  If you have toxoplasmosis and are not pregnant, wait at least 6 months before becoming pregnant. HOW DO I KNOW IF I HAVE TOXOPLASMOSIS? The only way to know for sure is with a test. People with toxoplasmosis do not always have symptoms. If symptoms are present, they may include:  A fever.  Swollen glands.  Muscle aches.  Headaches. You may feel like you have the cold or the flu. If you think you have toxoplasmosis, or if you think you may have been exposed to it, call your health care  provider. HOW IS TOXOPLASMOSIS DIAGNOSED? When you become pregnant, your health care provider may order a blood test to check whether you have ever had toxoplasmosis. If you have had toxoplasmosis before, you cannot get it again. If you have never had toxoplasmosis, your health care provider may repeat this test at a later date. If you become infected during pregnancy, your health care provider may do more tests to find out whether the infection has spread to the baby. Tests may include amniocentesis and an ultrasound. HOW IS TOXOPLASMOSIS TREATED? Toxoplasmosis may be treated with antibiotics and other medicines. Some of these medicines can lower your baby's chance of developing complications later on. Medicines may need to be taken for up to one year.  WHAT SHOULD I DO AT HOME IF I AM DIAGNOSED WITH TOXOPLASMOSIS?  Only take medicine as directed by your health care provider.  Take antibiotic medicine as directed. Make sure to finish it even if you start to feel better.  Keep all follow-up appointments.   This information is not intended to replace advice given to you by your health care provider. Make sure you discuss any questions you have with your health care provider.   Document Released: 05/25/2000 Document Revised: 03/09/2014 Document Reviewed: 01/18/2013 Elsevier Interactive Patient Education 2016 [employer]. Pregnancy and Zika Virus Disease Zika virus disease, or Zika, is an illness that can spread to people from mosquitoes that carry the virus. It may also spread  from person to person through infected body fluids. Zika first occurred in [place], but recently it has spread to new areas. The virus occurs in tropical climates. The location of Zika continues to change. Most people who become infected with Zika virus do not develop serious illness. However, Zika may cause birth defects in an unborn baby whose mother is infected with the virus. It may also increase the risk of  miscarriage. WHAT ARE THE SYMPTOMS OF ZIKA VIRUS DISEASE? In many cases, people who have been infected with Zika virus do not develop any symptoms. If symptoms appear, they usually start about a week after the person is infected. Symptoms are usually mild. They may include:  Fever.  Rash.  Red eyes.  Joint pain. HOW DOES ZIKA VIRUS DISEASE SPREAD? The main way that [place] virus spreads is through the bite of a certain type of mosquito. Unlike most types of mosquitos, which bite only at night, the type of mosquito that carries Zika virus bites both at night and during the day. Zika virus can also spread through sexual contact, through a blood transfusion, and from a mother to her baby before or during birth. Once you have had Zika virus disease, it is unlikely that you will get it again. CAN I PASS ZIKA TO MY BABY DURING PREGNANCY? Yes, Zika can pass from a mother to her baby before or during birth. WHAT PROBLEMS CAN ZIKA CAUSE FOR MY BABY? A woman who is infected with Zika virus while pregnant is at risk of having her baby born with a condition in which the brain or head is smaller than expected (microcephaly). Babies who have microcephaly can have developmental delays, seizures, hearing problems, and vision problems. Having Zika virus disease during pregnancy can also increase the risk of miscarriage. HOW CAN ZIKA VIRUS DISEASE BE PREVENTED? There is no vaccine to prevent Zika. The best way to prevent the disease is to avoid infected mosquitoes and avoid exposure to body fluids that can spread the virus. Avoid any possible exposure to [place] by taking the following precautions. For women and their sex partners:  Avoid traveling to high-risk areas. The locations where [place] is being reported change often. To identify high-risk areas, check the CDC travel website: CreditChaos.com.ee  If you or your sex partner must travel to a high-risk area, talk with a health care provider before  and after traveling.  Take all precautions to avoid mosquito bites if you live in, or travel to, any of the high-risk areas. Insect repellents are safe to use during pregnancy.  Ask your health care provider when it is safe to have sexual contact. For women:  If you are pregnant or trying to become pregnant, avoid sexual contact with persons who may have been exposed to [place] virus, persons who have possible symptoms of Zika, or persons whose history you are unsure about. If you choose to have sexual contact with someone who may have been exposed to [place] virus, use condoms correctly during the entire duration of sexual activity, every time. Do not share sexual devices, as you may be exposed to body fluids.  Ask your health care provider about when it is safe to attempt pregnancy after a possible exposure to [place] virus. WHAT STEPS SHOULD I TAKE TO AVOID MOSQUITO BITES? Take these steps to avoid mosquito bites when you are in a high-risk area:  Wear loose clothing that covers your arms and legs.  Limit your outdoor activities.  Do not open windows unless they have  window screens.  Sleep under mosquito nets.  Use insect repellent. The best insect repellents have:  DEET, picaridin, oil of lemon eucalyptus (OLE), or [id number] in them.  Higher amounts of an active ingredient in them.  Remember that insect repellents are safe to use during pregnancy.  Do not use OLE on children who are younger than 3 years of age. Do not use insect repellent on babies who are younger than 2 months of age.  Cover your child's stroller with mosquito netting. Make sure the netting fits snugly and that any loose netting does not cover your child's mouth or nose. Do not use a blanket as a mosquito-protection cover.  Do not apply insect repellent underneath clothing.  If you are using sunscreen, apply the sunscreen before applying the insect repellent.  Treat clothing with permethrin. Do not apply permethrin  directly to your skin. Follow label directions for safe use.  Get rid of standing water, where mosquitoes may reproduce. Standing water is often found in items such as buckets, bowls, animal food dishes, and flowerpots. When you return from traveling to any high-risk area, continue taking actions to protect yourself against mosquito bites for 3 weeks, even if you show no signs of illness. This will prevent spreading Zika virus to uninfected mosquitoes. WHAT SHOULD I KNOW ABOUT THE SEXUAL TRANSMISSION OF ZIKA? People can spread Zika to their sexual partners during vaginal, anal, or oral sex, or by sharing sexual devices. Many people with Zika do not develop symptoms, so a person could spread the disease without knowing that they are infected. The greatest risk is to women who are pregnant or who may become pregnant. Zika virus can live longer in semen than it can live in blood. Couples can prevent sexual transmission of the virus by:  Using condoms correctly during the entire duration of sexual activity, every time. This includes vaginal, anal, and oral sex.  Not sharing sexual devices. Sharing increases your risk of being exposed to body fluid from another person.  Avoiding all sexual activity until your health care provider says it is safe. SHOULD I BE TESTED FOR ZIKA VIRUS? A sample of your blood can be tested for Zika virus. A pregnant woman should be tested if she may have been exposed to the virus or if she has symptoms of Zika. She may also have additional tests done during her pregnancy, such ultrasound testing. Talk with your health care provider about which tests are recommended.   This information is not intended to replace advice given to you by your health care provider. Make sure you discuss any questions you have with your health care provider.   Document Released: 11/07/2014 Document Reviewed: 10/31/2014 Elsevier Interactive Patient Education 2016 [employer] Inc. Minor Illnesses and  Medications in Pregnancy  Cold/Flu:  Sudafed for congestion- Robitussin (plain) for cough- Tylenol for discomfort.  Please follow the directions on the label.  Try not to take any more than needed.  OTC Saline nasal spray and air humidifier or cool-mist  Vaporizer to sooth nasal irritation and to loosen congestion.  It is also important to increase intake of non carbonated fluids, especially if you have a fever.  Constipation:  Colace-2 capsules at bedtime; Metamucil- follow directions on label; Senokot- 1 tablet at bedtime.  Any one of these medications can be used.  It is also very important to increase fluids and fruits along with regular exercise.  If problem persists please call the office.  Diarrhea:  Kaopectate as directed on the   label.  Eat a bland diet and increase fluids.  Avoid highly seasoned foods.  Headache:  Tylenol 1 or 2 tablets every 3-4 hours as needed  Indigestion:  Maalox, Mylanta, Tums or Rolaids- as directed on label.  Also try to eat small meals and avoid fatty, greasy or spicy foods.  Nausea with or without Vomiting:  Nausea in pregnancy is caused by increased levels of hormones in the body which influence the digestive system and cause irritation when stomach acids accumulate.  Symptoms usually subside after 1st trimester of pregnancy.  Try the following:  Keep saltines, graham crackers or dry toast by your bed to eat upon awakening.  Don't let your stomach get empty.  Try to eat 5-6 small meals per day instead of 3 large ones.  Avoid greasy fatty or highly seasoned foods.   Take OTC Unisom 1 tablet at bed time along with OTC Vitamin B6 25-50 mg 3 times per day.    If nausea continues with vomiting and you are unable to keep down food and fluids you may need a prescription medication.  Please notify your provider.   Sore throat:  Chloraseptic spray, throat lozenges and or plain Tylenol.  Vaginal Yeast Infection:  OTC Monistat for 7 days as directed on label.  If  symptoms do not resolve within a week notify provider.  If any of the above problems do not subside with recommended treatment please call the office for further assistance.   Do not take Aspirin, Advil, Motrin or Ibuprofen.  * * OTC= Over the counter Hyperemesis Gravidarum Hyperemesis gravidarum is a severe form of nausea and vomiting that happens during pregnancy. Hyperemesis is worse than morning sickness. It may cause you to have nausea or vomiting all day for many days. It may keep you from eating and drinking enough food and liquids. Hyperemesis usually occurs during the first half (the first 20 weeks) of pregnancy. It often goes away once a woman is in her second half of pregnancy. However, sometimes hyperemesis continues through an entire pregnancy.  CAUSES  The cause of this condition is not completely known but is thought to be related to changes in the body's hormones when pregnant. It could be from the high level of the pregnancy hormone or an increase in estrogen in the body.  SIGNS AND SYMPTOMS   Severe nausea and vomiting.  Nausea that does not go away.  Vomiting that does not allow you to keep any food down.  Weight loss and body fluid loss (dehydration).  Having no desire to eat or not liking food you have previously enjoyed. DIAGNOSIS  Your health care provider will do a physical exam and ask you about your symptoms. He or she may also order blood tests and urine tests to make sure something else is not causing the problem.  TREATMENT  You may only need medicine to control the problem. If medicines do not control the nausea and vomiting, you will be treated in the hospital to prevent dehydration, increased acid in the blood (acidosis), weight loss, and changes in the electrolytes in your body that may harm the unborn baby (fetus). You may need IV fluids.  HOME CARE INSTRUCTIONS   Only take over-the-counter or prescription medicines as directed by your health care  provider.  Try eating a couple of dry crackers or toast in the morning before getting out of bed.  Avoid foods and smells that upset your stomach.  Avoid fatty and spicy foods.  Eat 5-6  small meals a day.  Do not drink when eating meals. Drink between meals.  For snacks, eat high-protein foods, such as cheese.  Eat or suck on things that have ginger in them. Ginger helps nausea.  Avoid food preparation. The smell of food can spoil your appetite.  Avoid iron pills and iron in your multivitamins until after 3-4 months of being pregnant. However, consult with your health care provider before stopping any prescribed iron pills. SEEK MEDICAL CARE IF:   Your abdominal pain increases.  You have a severe headache.  You have vision problems.  You are losing weight. SEEK IMMEDIATE MEDICAL CARE IF:   You are unable to keep fluids down.  You vomit blood.  You have constant nausea and vomiting.  You have excessive weakness.  You have extreme thirst.  You have dizziness or fainting.  You have a fever or persistent symptoms for more than 2-3 days.  You have a fever and your symptoms suddenly get worse. MAKE SURE YOU:   Understand these instructions.  Will watch your condition.  Will get help right away if you are not doing well or get worse.   This information is not intended to replace advice given to you by your health care provider. Make sure you discuss any questions you have with your health care provider.   Document Released: 02/16/2005 Document Revised: 12/07/2012 Document Reviewed: 09/28/2012 Elsevier Interactive Patient Education 2016 [employer]. Commonly Asked Questions During Pregnancy  Cats: A parasite can be excreted in cat feces.  To avoid exposure you need to have another person empty the little box.  If you must empty the litter box you will need to wear gloves.  Wash your hands after handling your cat.  This parasite can also be found in raw or  undercooked meat so this should also be avoided.  Colds, Sore Throats, Flu: Please check your medication sheet to see what you can take for symptoms.  If your symptoms are unrelieved by these medications please call the office.  Dental Work: Most any dental work [employer] recommends is permitted.  X-rays should only be taken during the first trimester if absolutely necessary.  Your abdomen should be shielded with a lead apron during all x-rays.  Please notify your provider prior to receiving any x-rays.  Novocaine is fine; gas is not recommended.  If your dentist requires a note from [place] prior to dental work please call the office and we will provide one for you.  Exercise: Exercise is an important part of staying healthy during your pregnancy.  You may continue most exercises you were accustomed to prior to pregnancy.  Later in your pregnancy you will most likely notice you have difficulty with activities requiring balance like riding a bicycle.  It is important that you listen to your body and avoid activities that put you at a higher risk of falling.  Adequate rest and staying well hydrated are a must!  If you have questions about the safety of specific activities ask your provider.    Exposure to Children with illness: Try to avoid obvious exposure; report any symptoms to [place] when noted,  If you have chicken pos, red measles or mumps, you should be immune to these diseases.   Please do not take any vaccines while pregnant unless you have checked with your OB provider.  Fetal Movement: After 28 weeks we recommend you do "kick counts" twice daily.  Lie or sit down in a calm quiet environment and  count your baby movements "kicks".  You should feel your baby at least 10 times per hour.  If you have not felt 10 kicks within the first hour get up, walk around and have something sweet to eat or drink then repeat for an additional hour.  If count remains less than 10 per hour notify your  provider.  Fumigating: Follow your pest control agent's advice as to how long to stay out of your home.  Ventilate the area well before re-entering.  Hemorrhoids:   Most over-the-counter preparations can be used during pregnancy.  Check your medication to see what is safe to use.  It is important to use a stool softener or fiber in your diet and to drink lots of liquids.  If hemorrhoids seem to be getting worse please call the office.   Hot Tubs:  Hot tubs Jacuzzis and saunas are not recommended while pregnant.  These increase your internal body temperature and should be avoided.  Intercourse:  Sexual intercourse is safe during pregnancy as long as you are comfortable, unless otherwise advised by your provider.  Spotting may occur after intercourse; report any bright red bleeding that is heavier than spotting.  Labor:  If you know that you are in labor, please go to the hospital.  If you are unsure, please call the office and let us help you decide what to do.  Lifting, straining, etc:  If your job requires heavy lifting or straining please check with your provider for any limitations.  Generally, you should not lift items heavier than that you can lift simply with your hands and arms (no back muscles)  Painting:  Paint fumes do not harm your pregnancy, but may make you ill and should be avoided if possible.  Latex or water based paints have less odor than oils.  Use adequate ventilation while painting.  Permanents & Hair Color:  Chemicals in hair dyes are not recommended as they cause increase hair dryness which can increase hair loss during pregnancy.  " Highlighting" and permanents are allowed.  Dye may be absorbed differently and permanents may not hold as well during pregnancy.  Sunbathing:  Use a sunscreen, as skin burns easily during pregnancy.  Drink plenty of fluids; avoid over heating.  Tanning Beds:  Because their possible side effects are still unknown, tanning beds are not  recommended.  Ultrasound Scans:  Routine ultrasounds are performed at approximately 20 weeks.  You will be able to see your baby's general anatomy an if you would like to know the gender this can usually be determined as well.  If it is questionable when you conceived you may also receive an ultrasound early in your pregnancy for dating purposes.  Otherwise ultrasound exams are not routinely performed unless there is a medical necessity.  Although you can request a scan we ask that you pay for it when conducted because insurance does not cover " patient request" scans.  Work: If your pregnancy proceeds without complications you may work until your due date, unless your physician or employer advises otherwise.  Round Ligament Pain/Pelvic Discomfort:  Sharp, shooting pains not associated with bleeding are fairly common, usually occurring in the second trimester of pregnancy.  They tend to be worse when standing up or when you remain standing for long periods of time.  These are the result of pressure of certain pelvic ligaments called "round ligaments".  Rest, Tylenol and heat seem to be the most effective relief.  As the womb and fetus  grow, they rise out of the pelvis and the discomfort improves.  Please notify the office if your pain seems different than that described.  It may represent a more serious condition.

## 2015-06-12 ENCOUNTER — Other Ambulatory Visit: Payer: Self-pay | Admitting: Obstetrics and Gynecology

## 2015-06-12 DIAGNOSIS — O9989 Other specified diseases and conditions complicating pregnancy, childbirth and the puerperium: Principal | ICD-10-CM

## 2015-06-12 DIAGNOSIS — Z2839 Other underimmunization status: Secondary | ICD-10-CM

## 2015-06-12 DIAGNOSIS — O09899 Supervision of other high risk pregnancies, unspecified trimester: Secondary | ICD-10-CM

## 2015-06-12 DIAGNOSIS — Z283 Underimmunization status: Secondary | ICD-10-CM | POA: Insufficient documentation

## 2015-06-12 LAB — CBC WITH DIFFERENTIAL/PLATELET
BASOS ABS: 0 10*3/uL (ref 0.0–0.2)
Basos: 0 %
EOS (ABSOLUTE): 0.1 10*3/uL (ref 0.0–0.4)
Eos: 1 %
Hematocrit: 39.4 % (ref 34.0–46.6)
Hemoglobin: 13.4 g/dL (ref 11.1–15.9)
IMMATURE GRANS (ABS): 0 10*3/uL (ref 0.0–0.1)
IMMATURE GRANULOCYTES: 0 %
LYMPHS: 25 %
Lymphocytes Absolute: 1.9 10*3/uL (ref 0.7–3.1)
MCH: 29.6 pg (ref 26.6–33.0)
MCHC: 34 g/dL (ref 31.5–35.7)
MCV: 87 fL (ref 79–97)
Monocytes Absolute: 0.5 10*3/uL (ref 0.1–0.9)
Monocytes: 7 %
NEUTROS PCT: 67 %
Neutrophils Absolute: 4.9 10*3/uL (ref 1.4–7.0)
PLATELETS: 266 10*3/uL (ref 150–379)
RBC: 4.53 x10E6/uL (ref 3.77–5.28)
RDW: 13.5 % (ref 12.3–15.4)
WBC: 7.4 10*3/uL (ref 3.4–10.8)

## 2015-06-12 LAB — RPR: RPR Ser Ql: NONREACTIVE

## 2015-06-12 LAB — RH TYPE: Rh Factor: POSITIVE

## 2015-06-12 LAB — TOXOPLASMA ANTIBODIES- IGG AND  IGM
Toxoplasma Antibody- IgM: 4.4 [AU]/ml (ref 0.0–7.9)
Toxoplasma IgG Ratio: <3 [IU]/mL (ref 0.0–7.1)

## 2015-06-12 LAB — RUBELLA ANTIBODY, IGM: Rubella IgM: <20 [AU]/ml (ref 0.0–19.9)

## 2015-06-12 LAB — HIV ANTIBODY (ROUTINE TESTING W REFLEX): HIV Screen 4th Generation wRfx: NONREACTIVE

## 2015-06-12 LAB — VARICELLA ZOSTER ANTIBODY, IGM: Varicella IgM: <0.91 {index} (ref 0.00–0.90)

## 2015-06-12 LAB — ANTIBODY SCREEN: ANTIBODY SCREEN: NEGATIVE

## 2015-06-12 LAB — HEPATITIS B SURFACE ANTIGEN: HEP B S AG: NEGATIVE

## 2015-06-12 LAB — ABO

## 2015-06-13 LAB — URINALYSIS, ROUTINE W REFLEX MICROSCOPIC
Bilirubin, UA: NEGATIVE
Glucose, UA: NEGATIVE
Ketones, UA: NEGATIVE
Leukocytes, UA: NEGATIVE
NITRITE UA: NEGATIVE
PH UA: 6 (ref 5.0–7.5)
Protein, UA: NEGATIVE
RBC, UA: NEGATIVE
Specific Gravity, UA: 1.011 (ref 1.005–1.030)
UUROB: 0.2 mg/dL (ref 0.2–1.0)

## 2015-06-13 LAB — PAIN MGT SCRN (14 DRUGS), UR
Amphetamine Screen, Ur: NEGATIVE ng/mL
BENZODIAZEPINE SCREEN, URINE: NEGATIVE ng/mL
Barbiturate Screen, Ur: NEGATIVE ng/mL
Buprenorphine, Urine: NEGATIVE ng/mL
CREATININE(CRT), U: 23.9 mg/dL (ref 20.0–300.0)
Cannabinoids Ur Ql Scn: NEGATIVE ng/mL
Cocaine(Metab.)Screen, Urine: NEGATIVE ng/mL
FENTANYL, URINE: NEGATIVE pg/mL
METHADONE SCREEN, URINE: NEGATIVE ng/mL
Meperidine Screen, Urine: NEGATIVE ng/mL
OXYCODONE+OXYMORPHONE UR QL SCN: NEGATIVE ng/mL
Opiate Scrn, Ur: NEGATIVE ng/mL
PCP SCRN UR: NEGATIVE ng/mL
PH UR, DRUG SCRN: 6.3 (ref 4.5–8.9)
Propoxyphene, Screen: NEGATIVE ng/mL
Tramadol Ur Ql Scn: NEGATIVE ng/mL

## 2015-06-13 LAB — NICOTINE SCREEN, URINE: COTININE UR QL SCN: NEGATIVE ng/mL

## 2015-06-13 LAB — URINE CULTURE, OB REFLEX

## 2015-06-13 LAB — CULTURE, OB URINE

## 2015-06-14 LAB — GC/CHLAMYDIA PROBE AMP
Chlamydia trachomatis, NAA: NEGATIVE
NEISSERIA GONORRHOEAE BY PCR: NEGATIVE

## 2015-06-18 ENCOUNTER — Other Ambulatory Visit: Payer: BLUE CROSS/BLUE SHIELD

## 2015-06-19 ENCOUNTER — Ambulatory Visit (INDEPENDENT_AMBULATORY_CARE_PROVIDER_SITE_OTHER): Payer: BLUE CROSS/BLUE SHIELD

## 2015-06-19 DIAGNOSIS — Z331 Pregnant state, incidental: Secondary | ICD-10-CM

## 2015-06-19 DIAGNOSIS — N926 Irregular menstruation, unspecified: Secondary | ICD-10-CM | POA: Diagnosis not present

## 2015-06-19 DIAGNOSIS — Z36 Encounter for antenatal screening of mother: Secondary | ICD-10-CM | POA: Diagnosis not present

## 2015-06-19 DIAGNOSIS — Z3490 Encounter for supervision of normal pregnancy, unspecified, unspecified trimester: Secondary | ICD-10-CM

## 2015-06-19 DIAGNOSIS — Z369 Encounter for antenatal screening, unspecified: Secondary | ICD-10-CM

## 2015-06-26 ENCOUNTER — Telehealth: Payer: Self-pay | Admitting: *Deleted

## 2015-06-26 NOTE — Telephone Encounter (Signed)
Called pt advised her that it was ok to use Tylenol Sinus, also advised on the use on saline, and Netti Pot, cool mist humidifier for relief. Pt gave verbal understanding.

## 2015-06-26 NOTE — Telephone Encounter (Signed)
Patient called and states if she could take Tylenol sinus ( the generic brand).  The patient will be coming in to see Dr. Valentino Saxonherry as a NOB on 07/03/15. She is almost [redacted] wks pregnant. Pt is requesting a call back.  Call back number [phone]

## 2015-07-03 ENCOUNTER — Encounter: Payer: Self-pay | Admitting: Obstetrics and Gynecology

## 2015-07-03 ENCOUNTER — Ambulatory Visit (INDEPENDENT_AMBULATORY_CARE_PROVIDER_SITE_OTHER): Payer: BLUE CROSS/BLUE SHIELD | Admitting: Obstetrics and Gynecology

## 2015-07-03 VITALS — BP 110/65 | HR 80 | Wt 161.1 lb

## 2015-07-03 DIAGNOSIS — Z3491 Encounter for supervision of normal pregnancy, unspecified, first trimester: Secondary | ICD-10-CM | POA: Diagnosis not present

## 2015-07-03 DIAGNOSIS — O98511 Other viral diseases complicating pregnancy, first trimester: Secondary | ICD-10-CM

## 2015-07-03 DIAGNOSIS — Z124 Encounter for screening for malignant neoplasm of cervix: Secondary | ICD-10-CM

## 2015-07-03 DIAGNOSIS — F419 Anxiety disorder, unspecified: Secondary | ICD-10-CM

## 2015-07-03 DIAGNOSIS — R110 Nausea: Secondary | ICD-10-CM

## 2015-07-03 DIAGNOSIS — K0889 Other specified disorders of teeth and supporting structures: Secondary | ICD-10-CM

## 2015-07-03 DIAGNOSIS — B009 Herpesviral infection, unspecified: Secondary | ICD-10-CM

## 2015-07-03 LAB — POCT URINALYSIS DIPSTICK
Bilirubin, UA: NEGATIVE
GLUCOSE UA: NEGATIVE
Ketones, UA: 5
Leukocytes, UA: NEGATIVE
NITRITE UA: NEGATIVE
PH UA: 6
PROTEIN UA: NEGATIVE
RBC UA: NEGATIVE
SPEC GRAV UA: 1.01
UROBILINOGEN UA: NEGATIVE

## 2015-07-03 MED ORDER — VALACYCLOVIR HCL 500 MG PO TABS: 500.0000 mg | ORAL_TABLET | Freq: Two times a day (BID) | ORAL | Status: DC

## 2015-07-03 MED ORDER — SAM-E 400 MG PO TBEC
1.0000 | DELAYED_RELEASE_TABLET | Freq: Every day | ORAL | Status: AC
Start: 2015-07-03 — End: ?
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
## 2015-07-05 ENCOUNTER — Telehealth: Payer: Self-pay | Admitting: Obstetrics and Gynecology

## 2015-07-05 DIAGNOSIS — B009 Herpesviral infection, unspecified: Secondary | ICD-10-CM

## 2015-07-05 DIAGNOSIS — O98511 Other viral diseases complicating pregnancy, first trimester: Principal | ICD-10-CM

## 2015-07-05 NOTE — Telephone Encounter (Signed)
This pt called Friday and said she cannot take the generic for Val;trex, it makes her sleepy, she wants the name brand Valtrex send in please.

## 2015-07-06 LAB — PAP IG AND HPV HIGH-RISK
HPV, high-risk: NEGATIVE
PAP SMEAR COMMENT: 0

## 2015-07-07 DIAGNOSIS — O98519 Other viral diseases complicating pregnancy, unspecified trimester: Secondary | ICD-10-CM

## 2015-07-07 DIAGNOSIS — B009 Herpesviral infection, unspecified: Secondary | ICD-10-CM | POA: Insufficient documentation

## 2015-07-08 ENCOUNTER — Telehealth: Payer: Self-pay

## 2015-07-08 MED ORDER — VALTREX 500 MG PO TABS: 500.0000 mg | ORAL_TABLET | Freq: Two times a day (BID) | ORAL | Status: DC

## 2015-07-08 NOTE — Telephone Encounter (Signed)
-----   Message from Hildred LaserAnika Cherry, MD sent at 07/06/2015 11:02 PM EDT ----- Please inform of normal pap smear.

## 2015-07-08 NOTE — Telephone Encounter (Signed)
Please let pt know that I will send in a rx for name brand however it is up to medicaid whether or nor they will cover it. Thanks

## 2015-07-08 NOTE — Telephone Encounter (Signed)
Called pt Tina Benjamin informing pt of normal PAP.

## 2015-07-18 ENCOUNTER — Telehealth: Payer: Self-pay | Admitting: Obstetrics and Gynecology

## 2015-07-18 DIAGNOSIS — B009 Herpesviral infection, unspecified: Secondary | ICD-10-CM

## 2015-07-18 DIAGNOSIS — O98511 Other viral diseases complicating pregnancy, first trimester: Principal | ICD-10-CM

## 2015-07-18 MED ORDER — VALTREX 500 MG PO TABS: 500.0000 mg | ORAL_TABLET | Freq: Two times a day (BID) | ORAL | Status: DC

## 2015-07-18 NOTE — Telephone Encounter (Signed)
Please let pt know that the pharmacy needs to contact us, I did send the in the prescription to [facility] [place] because it was sent to the [facility] [place] previously. The prescription was written DAW which means that they should dispense it as written.

## 2015-07-18 NOTE — Telephone Encounter (Signed)
Pt said the pharmcay needs a RX written for Valtrex stating that is medically necessary for her to take the name brand. [place] [facility] fax to [phone]

## 2015-08-01 ENCOUNTER — Encounter: Payer: Self-pay | Admitting: Obstetrics and Gynecology

## 2015-08-01 ENCOUNTER — Ambulatory Visit (INDEPENDENT_AMBULATORY_CARE_PROVIDER_SITE_OTHER): Payer: BLUE CROSS/BLUE SHIELD | Admitting: Obstetrics and Gynecology

## 2015-08-01 VITALS — BP 109/71 | HR 76 | Wt 165.0 lb

## 2015-08-01 DIAGNOSIS — Z3482 Encounter for supervision of other normal pregnancy, second trimester: Secondary | ICD-10-CM

## 2015-08-01 DIAGNOSIS — A499 Bacterial infection, unspecified: Secondary | ICD-10-CM

## 2015-08-01 DIAGNOSIS — Z3492 Encounter for supervision of normal pregnancy, unspecified, second trimester: Secondary | ICD-10-CM

## 2015-08-01 DIAGNOSIS — N760 Acute vaginitis: Secondary | ICD-10-CM

## 2015-08-01 DIAGNOSIS — B9689 Other specified bacterial agents as the cause of diseases classified elsewhere: Secondary | ICD-10-CM

## 2015-08-01 LAB — POCT URINALYSIS DIPSTICK
Bilirubin, UA: NEGATIVE
Blood, UA: NEGATIVE
Glucose, UA: NEGATIVE
KETONES UA: NEGATIVE
LEUKOCYTES UA: NEGATIVE
Nitrite, UA: NEGATIVE
PH UA: 7
PROTEIN UA: NEGATIVE
SPEC GRAV UA: 1.015
UROBILINOGEN UA: NEGATIVE

## 2015-08-01 MED ORDER — METRONIDAZOLE 500 MG PO TABS: 500.0000 mg | ORAL_TABLET | Freq: Two times a day (BID) | ORAL | Status: DC

## 2015-08-01 MED ORDER — FLUCONAZOLE 150 MG PO TABS: 150.0000 mg | ORAL_TABLET | Freq: Once | ORAL | Status: DC
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
## 2015-08-12 ENCOUNTER — Other Ambulatory Visit: Payer: Self-pay

## 2015-08-12 DIAGNOSIS — O98512 Other viral diseases complicating pregnancy, second trimester: Principal | ICD-10-CM

## 2015-08-12 DIAGNOSIS — B009 Herpesviral infection, unspecified: Secondary | ICD-10-CM

## 2015-08-12 MED ORDER — VALACYCLOVIR HCL 500 MG PO TABS: 500.0000 mg | ORAL_TABLET | Freq: Two times a day (BID) | ORAL | Status: AC

## 2015-08-12 NOTE — Telephone Encounter (Signed)
[facility] as pt had previously requested brand name Valtrex as she states generic makes her sleepy. RX was sent in DAW, prior auth was denied. Pt needs to get generic rx as medicaid will not cover brand. RX sent.

## 2015-08-15 ENCOUNTER — Telehealth: Payer: Self-pay

## 2015-08-15 NOTE — Telephone Encounter (Signed)
-----   Message from Hildred LaserAnika Cherry, MD sent at 08/09/2015  9:49 AM EDT ----- Please inform of yeast infection. Can treat with OTC Monistat 3 day or 7 day or can prescribe miconazole cream (2%) nightly x 7 days.

## 2015-08-15 NOTE — Telephone Encounter (Signed)
Called pt she states that she has already treated yeast infection with the diflucan she was prescribed at last visit. States she is asymptomatic.

## 2015-08-28 ENCOUNTER — Ambulatory Visit (INDEPENDENT_AMBULATORY_CARE_PROVIDER_SITE_OTHER): Payer: BLUE CROSS/BLUE SHIELD

## 2015-08-28 ENCOUNTER — Ambulatory Visit (INDEPENDENT_AMBULATORY_CARE_PROVIDER_SITE_OTHER): Payer: BLUE CROSS/BLUE SHIELD | Admitting: Obstetrics and Gynecology

## 2015-08-28 VITALS — BP 104/67 | HR 91 | Wt 169.7 lb

## 2015-08-28 DIAGNOSIS — Z3482 Encounter for supervision of other normal pregnancy, second trimester: Secondary | ICD-10-CM

## 2015-08-28 DIAGNOSIS — Z3492 Encounter for supervision of normal pregnancy, unspecified, second trimester: Secondary | ICD-10-CM

## 2015-08-28 LAB — POCT URINALYSIS DIPSTICK
BILIRUBIN UA: NEGATIVE
Blood, UA: NEGATIVE
GLUCOSE UA: NEGATIVE
KETONES UA: NEGATIVE
LEUKOCYTES UA: NEGATIVE
NITRITE UA: NEGATIVE
PH UA: 6
Protein, UA: NEGATIVE
Spec Grav, UA: <=1.005
Urobilinogen, UA: NEGATIVE

## 2015-08-30 NOTE — Progress Notes (Signed)
ROB: Patient c/o round ligament pain.  Discussed Tylenol, warm baths prn.  Also notes not sleeping well, having crazy dreams.  Advised on Benadryl or Unisom.  Also can use herbal teas or aromatherapy (particularly lavender scent).  S/p normal anatomy scan.  Has not started Sam-E yet for mood symptoms but notes that she will. RTC in 4 weeks.

## 2015-09-11 LAB — NUSWAB VAGINITIS PLUS (VG+)
CANDIDA GLABRATA, NAA: NEGATIVE
CHLAMYDIA TRACHOMATIS, NAA: NEGATIVE
Candida albicans, NAA: POSITIVE — AB
NEISSERIA GONORRHOEAE, NAA: NEGATIVE
TRICH VAG BY NAA: NEGATIVE

## 2015-10-01 ENCOUNTER — Ambulatory Visit (INDEPENDENT_AMBULATORY_CARE_PROVIDER_SITE_OTHER): Payer: BLUE CROSS/BLUE SHIELD | Admitting: Obstetrics and Gynecology

## 2015-10-01 VITALS — BP 117/69 | HR 81 | Wt 173.4 lb

## 2015-10-01 DIAGNOSIS — R631 Polydipsia: Secondary | ICD-10-CM

## 2015-10-01 DIAGNOSIS — B009 Herpesviral infection, unspecified: Secondary | ICD-10-CM

## 2015-10-01 DIAGNOSIS — Z3482 Encounter for supervision of other normal pregnancy, second trimester: Secondary | ICD-10-CM

## 2015-10-01 DIAGNOSIS — O98512 Other viral diseases complicating pregnancy, second trimester: Secondary | ICD-10-CM

## 2015-10-01 DIAGNOSIS — Z8659 Personal history of other mental and behavioral disorders: Secondary | ICD-10-CM

## 2015-10-01 LAB — POCT URINALYSIS DIPSTICK
BILIRUBIN UA: NEGATIVE
Glucose, UA: NEGATIVE
KETONES UA: NEGATIVE
LEUKOCYTES UA: NEGATIVE
Nitrite, UA: NEGATIVE
PH UA: 6.5
PROTEIN UA: NEGATIVE
Spec Grav, UA: <=1.005
Urobilinogen, UA: NEGATIVE

## 2015-10-01 NOTE — Progress Notes (Signed)
ROB: Patient complains of being excessively thirsty, despite adequate fluid intake. Also complains of sore throat and white patches noted in the back of her throat, is concerned about a possible HSV outbreak. Also notes tingling in vaginal area however has not seen any lesions.  In addition does note some mild constipation however is implementing dietary changes. UA today with no evidence of glucosuria. Will have patient return sooner for GDM screening. Examination of throat reveals no visible lesions. Unsure if possible HSV infection.  Advised on taking Acyclovir as prescribed.  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 Plans to use placenta for encapsulation to help with management as patient notes this is the only thing that helps. RTC in 4 weeks.

## 2015-10-03 ENCOUNTER — Ambulatory Visit (INDEPENDENT_AMBULATORY_CARE_PROVIDER_SITE_OTHER): Payer: BLUE CROSS/BLUE SHIELD | Admitting: Primary Care

## 2015-10-03 ENCOUNTER — Encounter: Payer: Self-pay | Admitting: Primary Care

## 2015-10-03 VITALS — BP 110/68 | HR 86 | Temp 97.9°F | Ht 65.0 in | Wt 174.0 lb

## 2015-10-03 DIAGNOSIS — J029 Acute pharyngitis, unspecified: Secondary | ICD-10-CM

## 2015-10-03 LAB — POCT RAPID STREP A (OFFICE): RAPID STREP A SCREEN: NEGATIVE
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
## 2015-10-03 NOTE — Progress Notes (Signed)
Pre visit review using our clinic review tool, if applicable. No additional management support is needed unless otherwise documented below in the visit note. 

## 2015-10-03 NOTE — Patient Instructions (Signed)
Your strep test is negative.  Your symptoms are likely caused by a virus.   Try warm salt gargles three times daily to help soothe your throat. Continue tylenol as needed for discomfort.  Please call me if you develop fevers of 101 and/or if you throat continues to bother you by Wednesday next week.  It was a pleasure to see you today!

## 2015-10-16 ENCOUNTER — Other Ambulatory Visit: Payer: BLUE CROSS/BLUE SHIELD

## 2015-10-16 DIAGNOSIS — Z3482 Encounter for supervision of other normal pregnancy, second trimester: Secondary | ICD-10-CM

## 2015-10-17 LAB — CBC
Hematocrit: 35.7 % (ref 34.0–46.6)
Hemoglobin: 12 g/dL (ref 11.1–15.9)
MCH: 29.9 pg (ref 26.6–33.0)
MCHC: 33.6 g/dL (ref 31.5–35.7)
MCV: 89 fL (ref 79–97)
PLATELETS: 221 10*3/uL (ref 150–379)
RBC: 4.02 x10E6/uL (ref 3.77–5.28)
RDW: 13.9 % (ref 12.3–15.4)
WBC: 8.9 10*3/uL (ref 3.4–10.8)

## 2015-10-17 LAB — GLUCOSE, 1 HOUR GESTATIONAL: GESTATIONAL DIABETES SCREEN: 102 mg/dL (ref 65–139)

## 2015-10-31 ENCOUNTER — Encounter: Payer: BLUE CROSS/BLUE SHIELD | Admitting: Obstetrics and Gynecology

## 2016-04-15 ENCOUNTER — Encounter (HOSPITAL_COMMUNITY): Payer: Self-pay

## 2016-10-05 IMAGING — CR DG CHEST 2V
2 series · 2 of 2 positions shown · non-contrast
Comparison: None.

CLINICAL DATA: Abdominal pain after surgical abortion 2 days prior.

EXAM:
CHEST  2 VIEW

[chest pa]
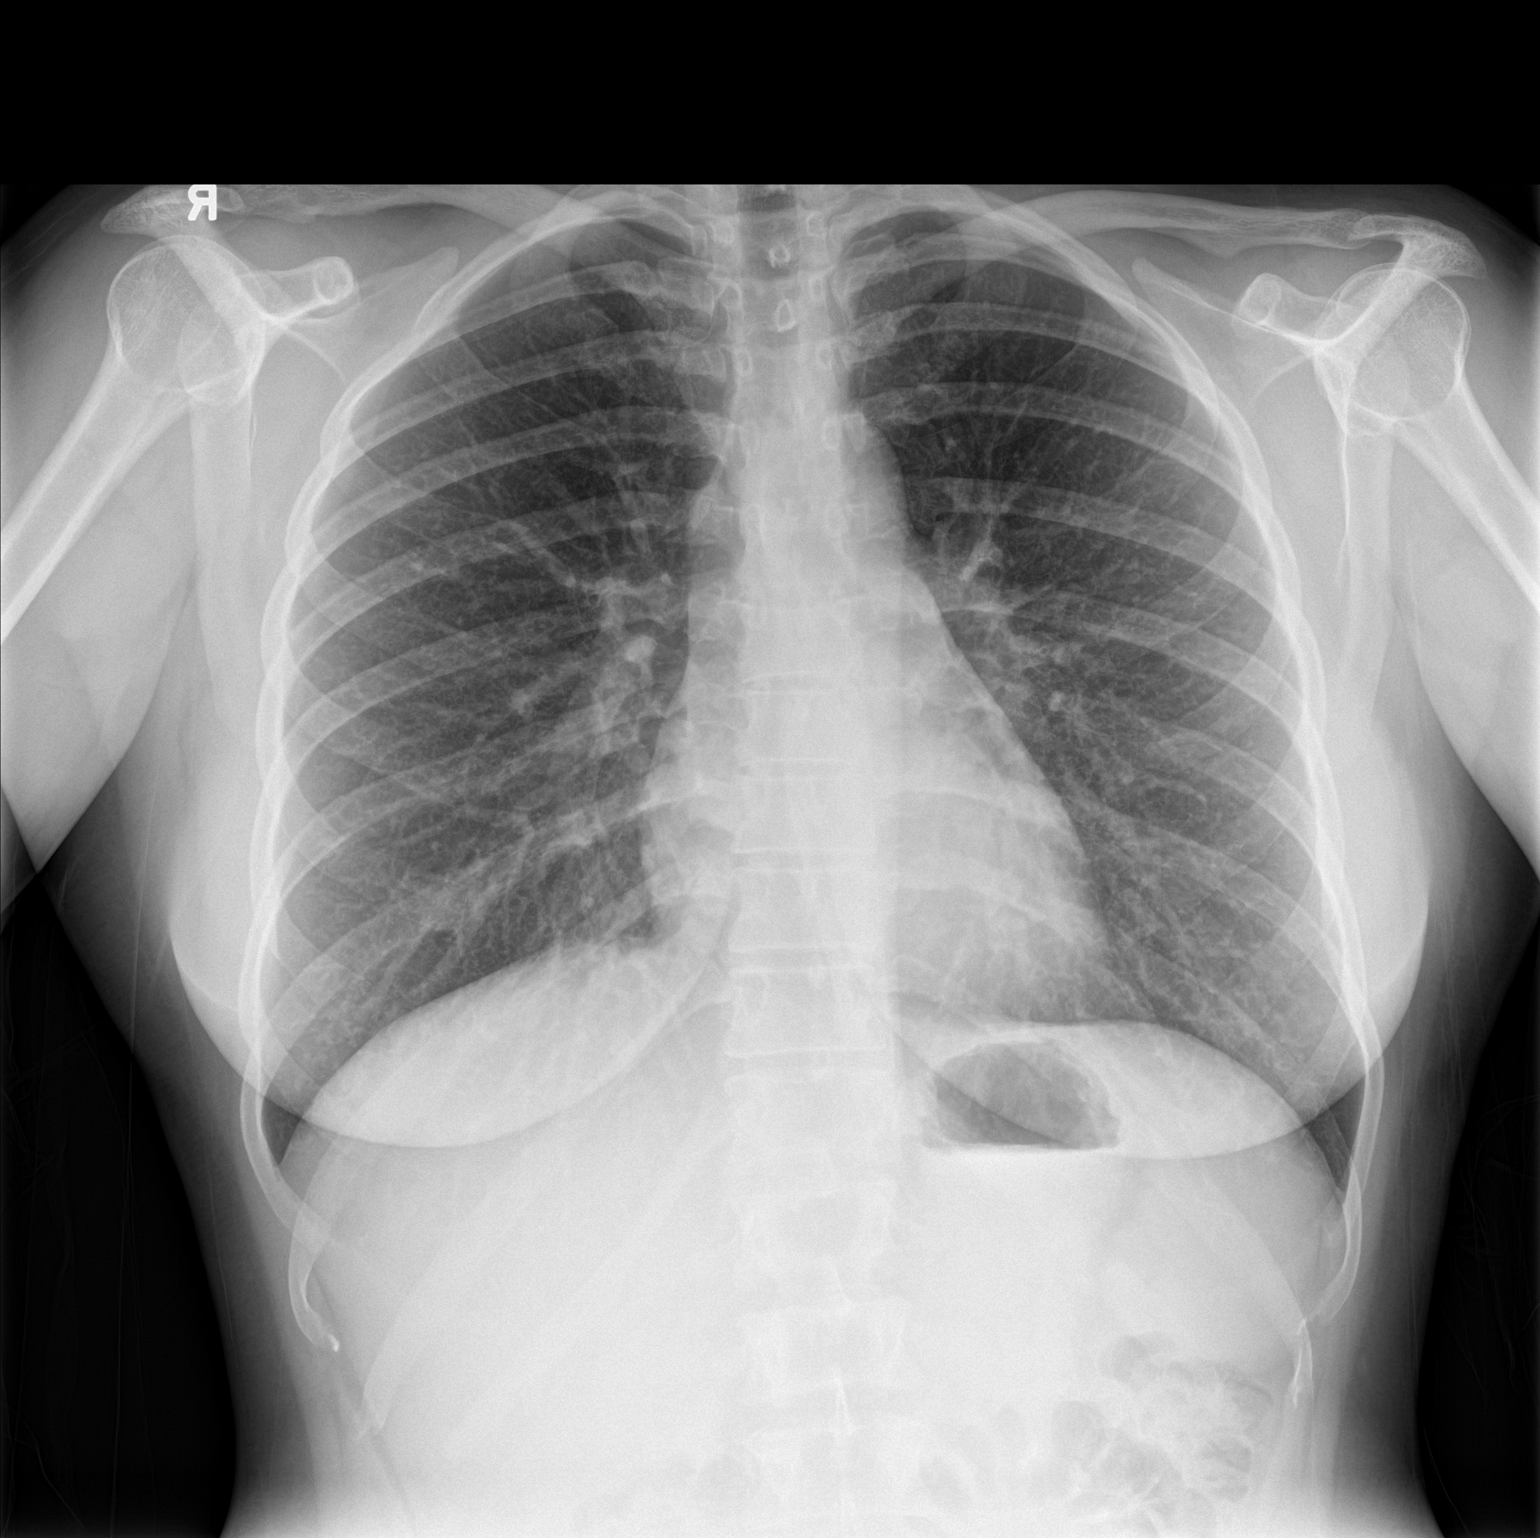

[chest lat]
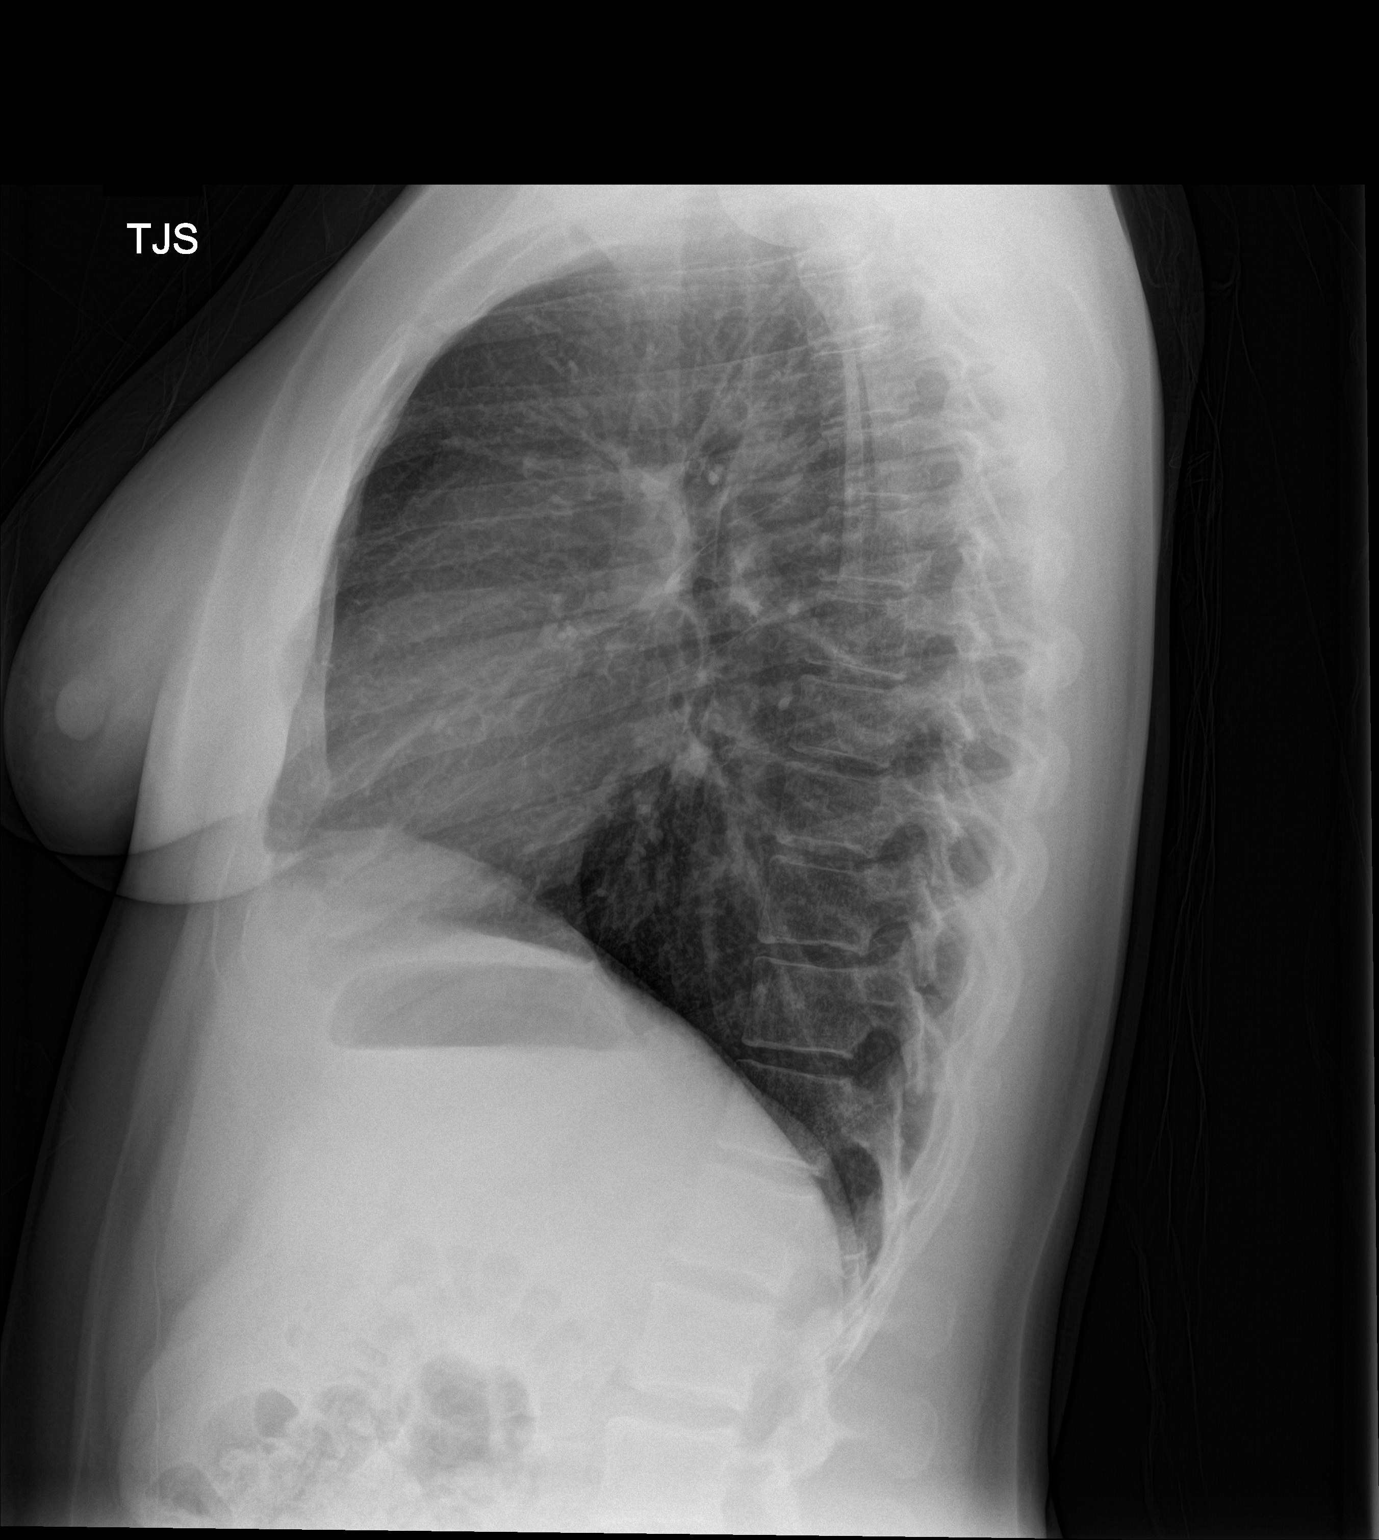

[2 of 2 positions shown; findings below may reference images not displayed]

FINDINGS: Central bronchial thickening. The cardiomediastinal contours are
normal. The lungs are clear. Pulmonary vasculature is normal. No
consolidation, pleural effusion, or pneumothorax. No acute osseous
abnormalities are seen. Normal bowel gas pattern in the included
upper abdomen.
IMPRESSION: Central bronchial thickening. This may reflect asthma or bronchitis.
